# Patient Record
Sex: Male | Born: 1965 | Race: White | Hispanic: No | Marital: Single | Smoking: Never smoker
Health system: Southern US, Community
[De-identification: ages and names within clinical notes are randomized; demographics above are authoritative.]

## PROBLEM LIST (undated history)

## (undated) DIAGNOSIS — N201 Calculus of ureter: Secondary | ICD-10-CM

## (undated) DIAGNOSIS — E785 Hyperlipidemia, unspecified: Secondary | ICD-10-CM

## (undated) DIAGNOSIS — R109 Unspecified abdominal pain: Secondary | ICD-10-CM

## (undated) DIAGNOSIS — N529 Male erectile dysfunction, unspecified: Secondary | ICD-10-CM

## (undated) DIAGNOSIS — B009 Herpesviral infection, unspecified: Secondary | ICD-10-CM

## (undated) DIAGNOSIS — N200 Calculus of kidney: Secondary | ICD-10-CM

## (undated) HISTORY — DX: Male erectile dysfunction, unspecified: N52.9

## (undated) HISTORY — DX: Herpesviral infection, unspecified: B00.9

---

## 2010-01-04 ENCOUNTER — Encounter: Admission: RE | Admit: 2010-01-04 | Discharge: 2010-01-04 | Payer: Self-pay | Admitting: Family Medicine

## 2010-02-01 ENCOUNTER — Ambulatory Visit (HOSPITAL_COMMUNITY): Admission: RE | Admit: 2010-02-01 | Discharge: 2010-02-01 | Payer: Self-pay | Admitting: Urology

## 2010-02-01 HISTORY — PX: EXTRACORPOREAL SHOCK WAVE LITHOTRIPSY: SHX1557

## 2012-04-20 IMAGING — CT CT ABD-PELV W/O CM
3 of 4 series · 12 of 36 positions shown, 19 images · non-contrast
Comparison: None.

CLINICAL DATA: Abdominal pain.

CT ABDOMEN AND PELVIS WITHOUT CONTRAST
TECHNIQUE: Multidetector CT imaging of the abdomen and pelvis was
performed following the standard protocol without intravenous
contrast.

[Series 3: unenhanced · axial · 0.76mm/px · z∈[-405,-40]mm · 9 of 93 slices shown, 15 images]
[im 10/93  soft-tissue]
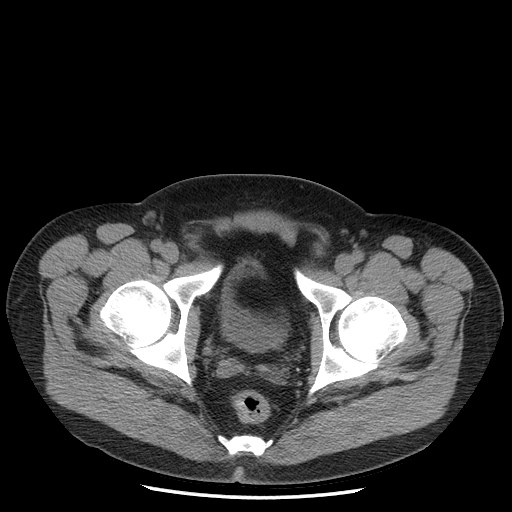
[im 10/93  bone]
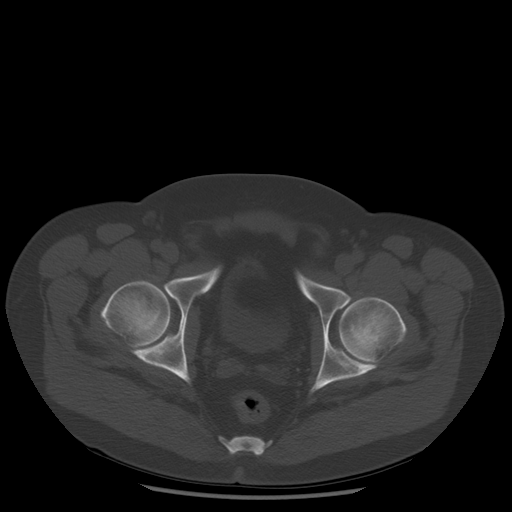
[im 19/93  soft-tissue]
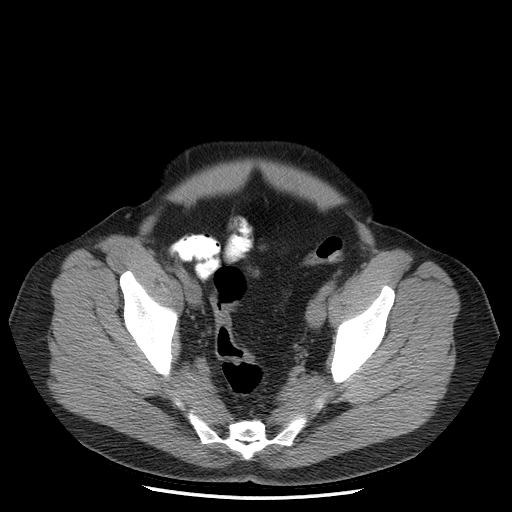
[im 28/93  soft-tissue]
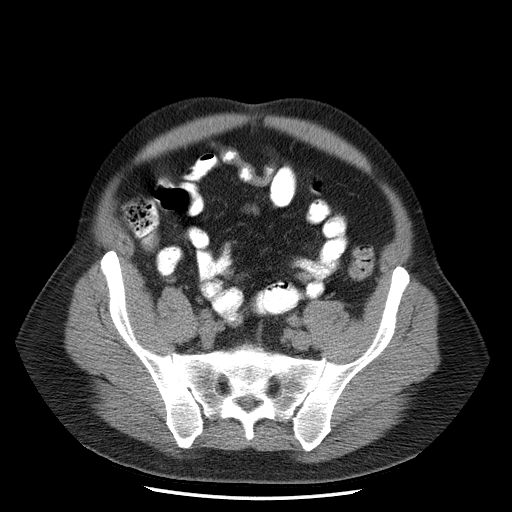
[im 37/93  soft-tissue]
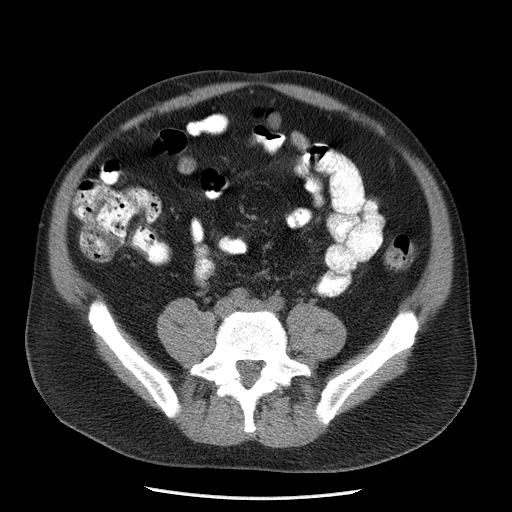
[im 47/93  soft-tissue]
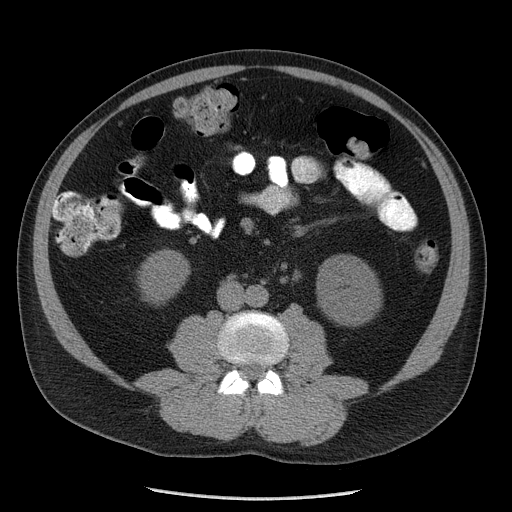
[im 56/93  soft-tissue]
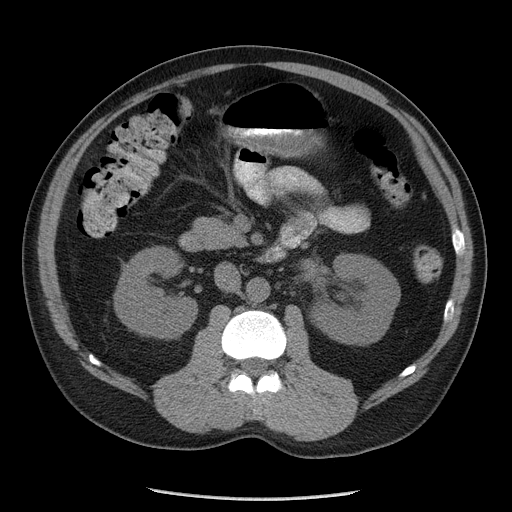
[im 56/93  lung]
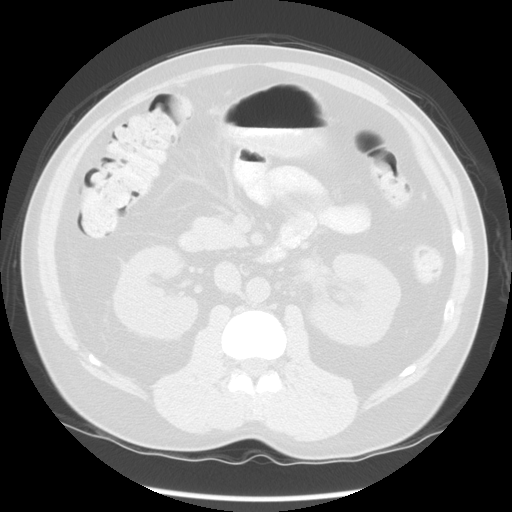
[im 65/93  soft-tissue]
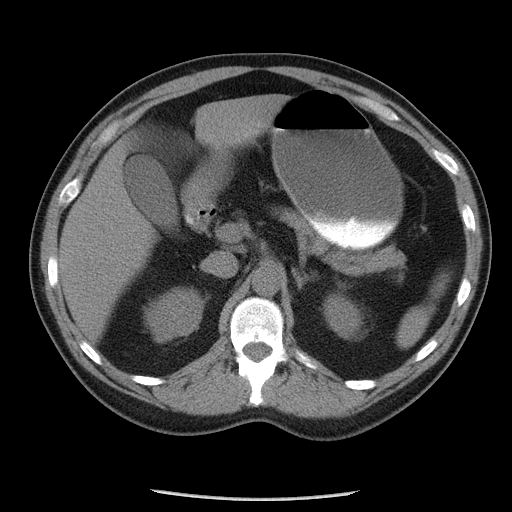
[im 65/93  lung]
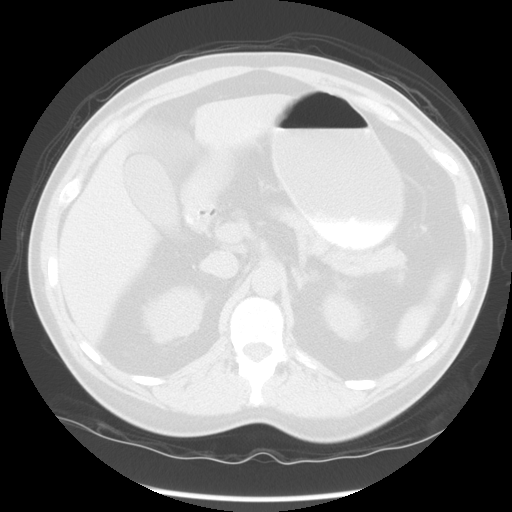
[im 74/93  soft-tissue]
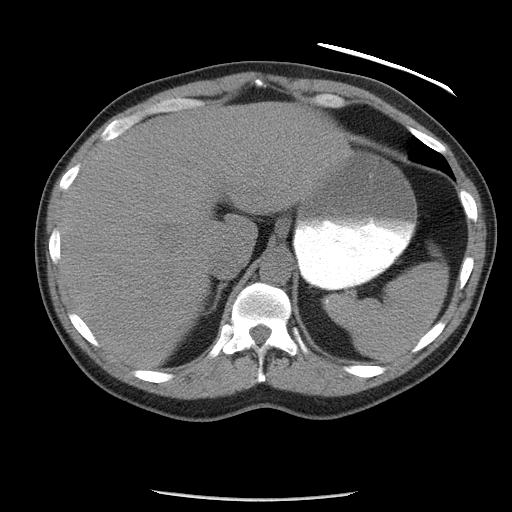
[im 74/93  lung]
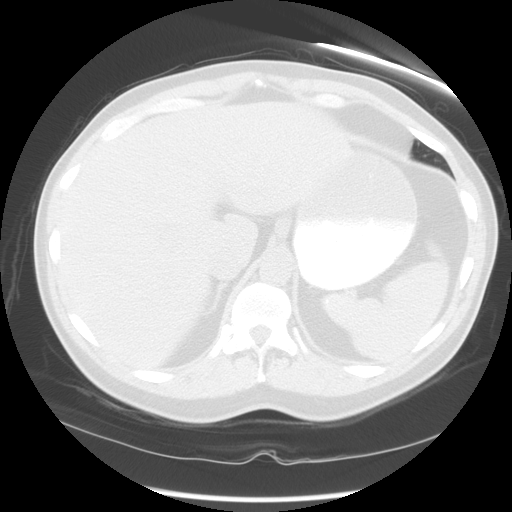
[im 83/93  soft-tissue]
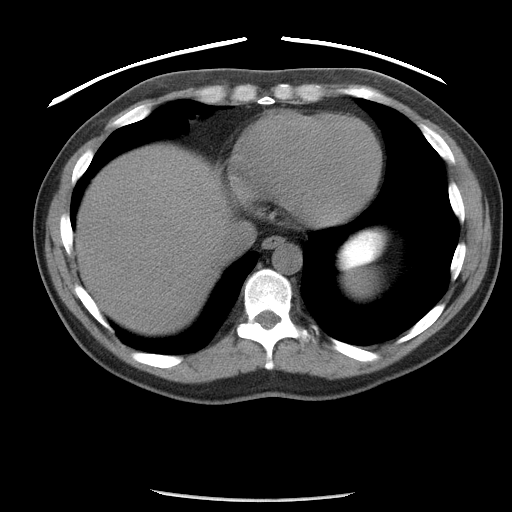
[im 83/93  lung]
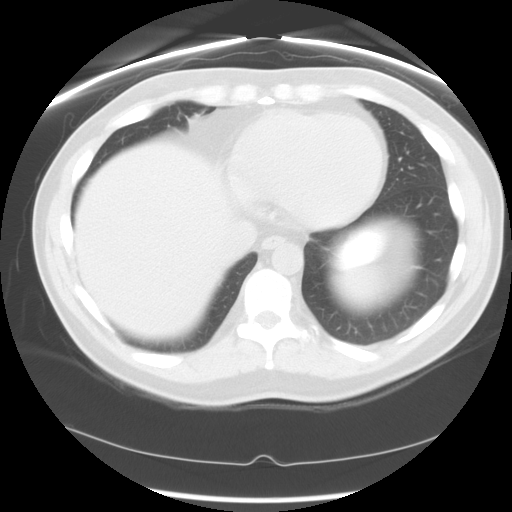
[im 83/93  bone]
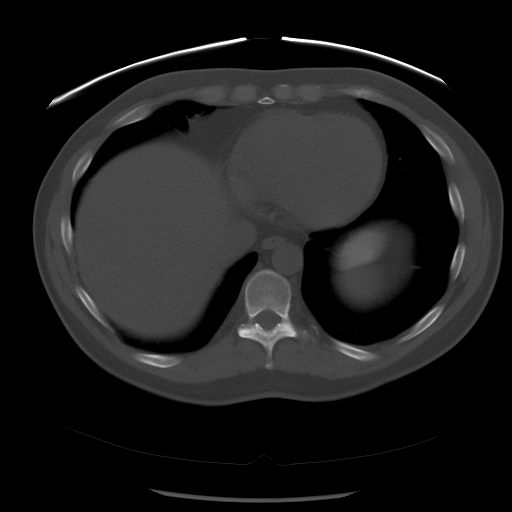

[Series 601: coronal body · coronal · 0.96mm/px · 1 of 137 slices shown, 2 images]
[im 46/137  soft-tissue]
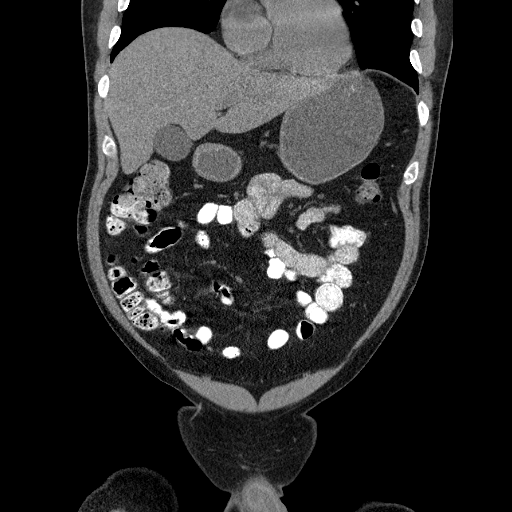
[im 46/137  bone]
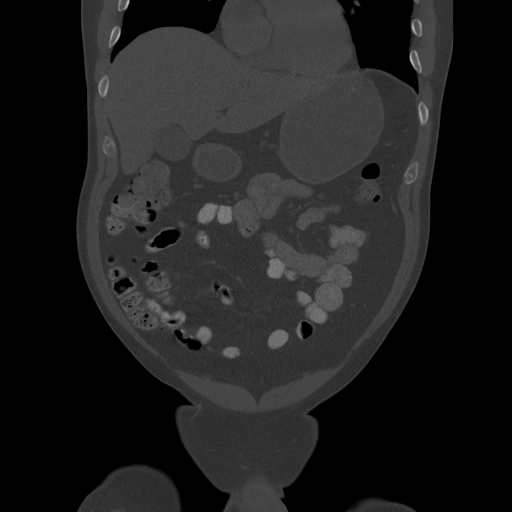

[Series 602: sagittal body · sagittal · 0.96mm/px · 2 of 157 slices shown]
[im 19/157  soft-tissue]
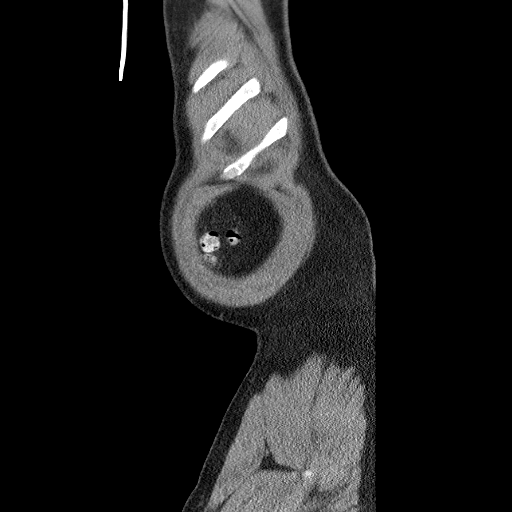
[im 37/157  soft-tissue]
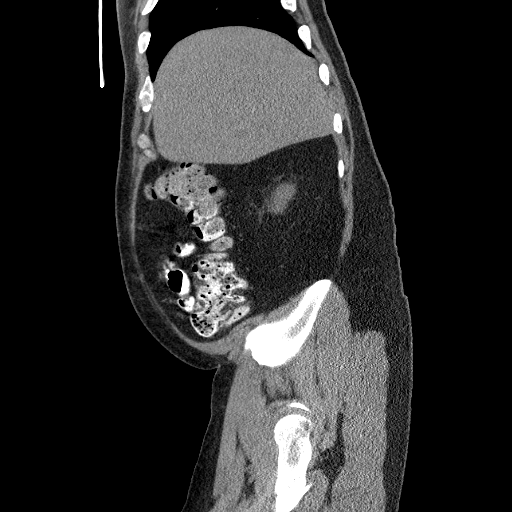

[12 of 36 positions shown; findings below may reference images not displayed]

FINDINGS: There is a 5 mm mid-left ureteral calculus present with
moderate left proximal hydroureter and left hydronephrosis.  There
are several small mid right renal calculi measuring approximately 2-
3 mm in size.  There is no right hydronephrosis.  The liver,
spleen, pancreas, adrenal glands, and gallbladder have a normal
appearance.  There is no adenopathy.  There is no free peritoneal
fluid or air.  The bowel, as visualized, has a normal appearance.

No pelvic mass or adenopathy is present.  Urinary bladder is
decompressed.
IMPRESSION: 1.  5 mm mid left ureteral calculus producing moderate left
hydronephrosis.
2.  Small right renal calculi.

## 2012-06-22 ENCOUNTER — Other Ambulatory Visit: Payer: Self-pay | Admitting: *Deleted

## 2012-08-08 ENCOUNTER — Encounter: Payer: Self-pay | Admitting: Family Medicine

## 2012-08-08 ENCOUNTER — Ambulatory Visit (INDEPENDENT_AMBULATORY_CARE_PROVIDER_SITE_OTHER): Payer: BC Managed Care – PPO | Admitting: Family Medicine

## 2012-08-08 VITALS — BP 139/81 | HR 78 | Temp 97.3°F | Ht 61.5 in | Wt 198.0 lb

## 2012-08-08 DIAGNOSIS — A6000 Herpesviral infection of urogenital system, unspecified: Secondary | ICD-10-CM

## 2012-08-08 DIAGNOSIS — N529 Male erectile dysfunction, unspecified: Secondary | ICD-10-CM

## 2012-08-08 MED ORDER — SILDENAFIL CITRATE 100 MG PO TABS: 100.0000 mg | ORAL_TABLET | Freq: Every day | ORAL | Status: DC | PRN

## 2012-08-08 MED ORDER — VALACYCLOVIR HCL 1 G PO TABS: 1000.0000 mg | ORAL_TABLET | Freq: Every day | ORAL | Status: DC

## 2012-08-08 NOTE — Progress Notes (Signed)
  Subjective:    Patient ID: Logan Mckee, male    DOB: 1965/08/20, 47 y.o.   MRN: [id number]  HPI Pt presents today for follow up of ED and genital herpes No acute issues or concerns ED and HSV well controlled with maintenance dose valtrex and viagra.  Would like refill today.     Review of Systems  All other systems reviewed and are negative.       Objective:   Physical Exam  Constitutional: He appears well-developed and well-nourished.  HENT:  Head: Normocephalic and atraumatic.  Eyes: Conjunctivae are normal. Pupils are equal, round, and reactive to light.  Neck: Normal range of motion.  Cardiovascular: Normal rate and regular rhythm.   Pulmonary/Chest: Effort normal.  Abdominal: Soft.  Musculoskeletal: Normal range of motion.  Neurological: He is alert.  Skin: Skin is warm.          Assessment & Plan:  Will refill meds.  Discussed LDL check. Pt deferred.  Follow up as needed.     The patient and/or caregiver has been counseled thoroughly with regard to treatment plan and/or medications prescribed including dosage, schedule, interactions, rationale for use, and possible side effects and they verbalize understanding. Diagnoses and expected course of recovery discussed and will return if not improved as expected or if the condition worsens. Patient and/or caregiver verbalized understanding.

## 2013-09-04 ENCOUNTER — Telehealth: Payer: Self-pay | Admitting: Physician Assistant

## 2013-09-04 NOTE — Telephone Encounter (Signed)
Apt scheduled.  

## 2013-09-05 ENCOUNTER — Ambulatory Visit (INDEPENDENT_AMBULATORY_CARE_PROVIDER_SITE_OTHER): Payer: BC Managed Care – PPO | Admitting: Physician Assistant

## 2013-09-05 ENCOUNTER — Encounter: Payer: Self-pay | Admitting: Physician Assistant

## 2013-09-05 VITALS — BP 118/74 | HR 83 | Temp 98.5°F | Ht 71.0 in | Wt 205.2 lb

## 2013-09-05 DIAGNOSIS — M79609 Pain in unspecified limb: Secondary | ICD-10-CM

## 2013-09-05 DIAGNOSIS — M79602 Pain in left arm: Secondary | ICD-10-CM

## 2013-09-05 NOTE — Patient Instructions (Signed)
Strain A strain is an injury to a muscle or the tissue that connects muscles to bones (tendon). In a strain injury, the muscle or tendon is either stretched or torn. Muscles are more susceptible to strains if they cross two joints, such as:  Hamstrings.  Quadriceps.  Calves.  Biceps. There are three categories of strains:  A first-degree strain is a small tear in the muscle. There is no lengthening of the muscle, but pain may be present with contraction of the muscle.  A second-degree strain is a small tear in the muscle accompanied by lengthening of the muscle. Muscles with a second-degree strain are still able to function.  A third-degree strain is a complete tear of the muscle. Muscles with a third-degree strain cannot function properly. Strains often have bleeding and bruising within the muscle. SYMPTOMS   Pain, tenderness, redness or bruising, and swelling in the area of injury.  Loss of normal mobility of the injured joint. CAUSES  A sudden force exerted on a muscle or tendon that it cannot withstand usually causes strains. This may be due to a sudden overload of a contracted muscle, overuse, or sudden increase or change in activity.  RISK INCREASES WITH:  Trauma.  Poor strength and flexibility.  Failure to warm-up properly before activity.  Return to activity before healing is complete. PREVENTION  Warm-up and stretch properly before and activity.  Maintain physical fitness:  Joint flexibility.  Muscle strength.  Endurance and conditioning.  Strengthen weak muscles with exercises to prevent recurrence. PROGNOSIS  If treated properly, strains are usually curable. The time it takes to recover is related to the severity of the injury and usually varies from 2 to 8 weeks. RELATED COMPLICATIONS   Re-injury or recurrence of symptoms, permanent weakness.  Joint stiffness if the strain is severe and rehabilitation is incomplete.  Delayed healing or resolution of  symptoms if sports are resumed before rehabilitation is complete.  Excessive bleeding into muscle, especially if taking anti-inflammatory medicines. This can lead to delayed recovery and injury to nerves, muscle, and blood vessels; this is an emergency. TREATMENT  Treatment initially involves ice and medicine to help reduce pain and inflammation. Use of the affected muscle should be limited by a:  Brace.  Elastic bandage wrapping.  Splint.  Cast.  Sling. Strengthening and stretching exercises may be necessary after immobilization to prevent joint stiffness. These exercises may be completed at home or with a therapist. If the tendon is torn, then surgery may be necessary to repair it.  MEDICATION   Avoid aspirin or ibuprofen in the first 48 hours after the injury. These medicines may increase the tendency to bleed. During this time, you may take pain relievers, such as acetaminophen, that do not affect bleeding.  After the first 48 hours, if pain medicine is necessary, then nonsteroidal anti-inflammatory medicines, such as aspirin and ibuprofen, or other minor pain relievers, such as acetaminophen, are often recommended.  Do not take pain medicine within 7 days before surgery.  Prescription pain relievers may be prescribed. Use only as directed and only as much as you need  Ointments applied to the skin may be helpful. HEAT AND COLD  Cold treatment (icing) relieves pain and reduces inflammation. Cold treatment should be applied for 10 to 15 minutes every 2 to 3 hours for inflammation and pain and immediately after any activity that aggravates your symptoms. Use ice packs or massage the area with a piece of ice (ice massage).  Heat treatment may be  used prior to performing the stretching and strengthening activities prescribed by your caregiver, physical therapist, or athletic trainer. Use a heat pack or soak your injury in warm water. SEEK MEDICAL CARE IF:   Symptoms get worse or do  not improve despite treatment.  Pain becomes intolerable.  You experience numbness or tingling.  Toes or fingernails become cold or develop a blue, gray, or dusky color.  New, unexplained symptoms develop (drugs used in treatment may produce side effects). Document Released: 03/07/2005 Document Revised: 05/30/2011 Document Reviewed: 06/19/2008 [facility] Patient Information 2015 [place], [place]. This information is not intended to replace advice given to you by your health care provider. Make sure you discuss any questions you have with your health care provider.

## 2013-09-05 NOTE — Progress Notes (Signed)
Subjective:     Patient ID: Logan Mckee, male   DOB: 20-Mar-1966, 48 y.o.   MRN: [id number]  HPI Pt with L arm and shoulder pain Pt states he was at work and had to move a bunch of cans He noted pain to the lateral elbow and post shoulder that night He then began to use heat and sx got better When he told his work they filed a WC claim and sent him to their MD He has done full work since His claim was denied and now work is telling him he needs a note form his MD that it is ok to work  Review of Systems  Musculoskeletal: Negative.        Objective:   Physical Exam As noted pt having no sx at time of appt No ecchy/edema seen No palp spasm or TTP of the L arm/shoulder FROM of LUE w/o sx Good pulses Nl strength    Assessment:     L arm/shoulder pain    Plan:     Pt w/o sx today Cont to observe Note for pt to full duty F/U prn

## 2013-09-10 ENCOUNTER — Encounter: Payer: Self-pay | Admitting: Physician Assistant

## 2013-09-10 ENCOUNTER — Ambulatory Visit (INDEPENDENT_AMBULATORY_CARE_PROVIDER_SITE_OTHER): Payer: BC Managed Care – PPO | Admitting: Physician Assistant

## 2013-09-10 ENCOUNTER — Other Ambulatory Visit: Payer: Self-pay | Admitting: Nurse Practitioner

## 2013-09-10 ENCOUNTER — Encounter (INDEPENDENT_AMBULATORY_CARE_PROVIDER_SITE_OTHER): Payer: Self-pay

## 2013-09-10 VITALS — BP 108/74 | HR 67 | Temp 98.0°F | Ht 71.0 in | Wt 204.2 lb

## 2013-09-10 DIAGNOSIS — Z0000 Encounter for general adult medical examination without abnormal findings: Secondary | ICD-10-CM

## 2013-09-10 DIAGNOSIS — N529 Male erectile dysfunction, unspecified: Secondary | ICD-10-CM | POA: Insufficient documentation

## 2013-09-10 MED ORDER — SILDENAFIL CITRATE 100 MG PO TABS: 100.0000 mg | ORAL_TABLET | Freq: Every day | ORAL | Status: DC | PRN

## 2013-09-10 MED ORDER — VALACYCLOVIR HCL 1 G PO TABS: 1000.0000 mg | ORAL_TABLET | Freq: Every day | ORAL | Status: DC

## 2013-09-10 NOTE — Progress Notes (Signed)
Subjective:     Patient ID: Logan Mckee, male   DOB: 1965/08/05, 48 y.o.   MRN: [id number]  HPI Pt here for PE States he has been doing well PMH of HSV and has not had an outbreak in 5 yrs since taking Valtrex Also hx of ED and doing well on Viagra Pt with repetitive job and has intermit L elbow pain Responds well to NSAIDS and rest  Review of Systems  Constitutional: Negative.   HENT: Negative.   Eyes: Negative.   Respiratory: Negative.   Cardiovascular: Negative.   Gastrointestinal: Negative.   Endocrine: Negative.   Genitourinary: Negative.   Musculoskeletal: Positive for myalgias.       L lateral elbow pain Denies any numbness  Skin: Negative.   Allergic/Immunologic: Negative.   Neurological: Negative.   Hematological: Negative.   Psychiatric/Behavioral: Negative.        Objective:   Physical Exam  Nursing note and vitals reviewed. Constitutional: He is oriented to person, place, and time. He appears well-developed and well-nourished.  HENT:  Head: Normocephalic and atraumatic.  Right Ear: External ear normal.  Left Ear: External ear normal.  Nose: Nose normal.  Mouth/Throat: Oropharynx is clear and moist.  Eyes: Conjunctivae and EOM are normal. Pupils are equal, round, and reactive to light.  Neck: Normal range of motion. Neck supple. No JVD present. No thyromegaly present.  Cardiovascular: Normal rate, regular rhythm, normal heart sounds and intact distal pulses.   Pulmonary/Chest: Effort normal and breath sounds normal. He exhibits no tenderness.  Abdominal: Soft. Bowel sounds are normal. He exhibits no distension and no mass. There is no tenderness. There is no rebound and no guarding.  Genitourinary: Penis normal.  Musculoskeletal: Normal range of motion. He exhibits no edema.  + TTP over the lateral epicond  Lymphadenopathy:    He has no cervical adenopathy.  Neurological: He is alert and oriented to person, place, and time. He has normal reflexes. No  cranial nerve deficit.  Skin: Skin is warm and dry.  Psychiatric: He has a normal mood and affect. His behavior is normal. Judgment and thought content normal.       Assessment:     Physical exam    Plan:     Discussed beginning a regular walking program and eating healthy RF of Valtrex and Viagra done today Full labs to include PSA done today- will inform of lab results OTC tennis elbow strap to help with his elbow pain Cont heat, ice, OTC NSAIDS F/U prn/pending labs

## 2013-09-10 NOTE — Telephone Encounter (Signed)
Viagra 100mg ,1 gd, qty 30,   2 refills and Valtrex 1000mg , gd, qty 90 and 2 refills, per provider, called to [facility] pharmacist.

## 2013-09-10 NOTE — Patient Instructions (Signed)
Exercise to Stay Healthy Exercise helps you become and stay healthy. EXERCISE IDEAS AND TIPS Choose exercises that:  You enjoy.  Fit into your day. You do not need to exercise really hard to be healthy. You can do exercises at a slow or medium level and stay healthy. You can:  Stretch before and after working out.  Try yoga, Pilates, or tai chi.  Lift weights.  Walk fast, swim, jog, run, climb stairs, bicycle, dance, or rollerskate.  Take aerobic classes. Exercises that burn about 150 calories:  Running 1  miles in 15 minutes.  Playing volleyball for 45 to 60 minutes.  Washing and waxing a car for 45 to 60 minutes.  Playing touch football for 45 minutes.  Walking 1  miles in 35 minutes.  Pushing a stroller 1  miles in 30 minutes.  Playing basketball for 30 minutes.  Raking leaves for 30 minutes.  Bicycling 5 miles in 30 minutes.  Walking 2 miles in 30 minutes.  Dancing for 30 minutes.  Shoveling snow for 15 minutes.  Swimming laps for 20 minutes.  Walking up stairs for 15 minutes.  Bicycling 4 miles in 15 minutes.  Gardening for 30 to 45 minutes.  Jumping rope for 15 minutes.  Washing windows or floors for 45 to 60 minutes. Document Released: 04/09/2010 Document Revised: 05/30/2011 Document Reviewed: 04/09/2010 Exit[employer] Patient Information 2015 [employer]. This information is not intended to replace advice given to you by your health care provider. Make sure you discuss any questions you have with your health care provider.  

## 2013-09-11 LAB — LIPID PANEL
CHOLESTEROL TOTAL: 192 mg/dL (ref 100–199)
Chol/HDL Ratio: 5.6 ratio units — ABNORMAL HIGH (ref 0.0–5.0)
HDL: 34 mg/dL — ABNORMAL LOW (ref >39)
LDL Calculated: 103 mg/dL — ABNORMAL HIGH (ref 0–99)
Triglycerides: 274 mg/dL — ABNORMAL HIGH (ref 0–149)
VLDL CHOLESTEROL CAL: 55 mg/dL — AB (ref 5–40)

## 2013-09-11 LAB — CMP14+EGFR
ALBUMIN: 4.4 g/dL (ref 3.5–5.5)
ALK PHOS: 70 IU/L (ref 39–117)
ALT: 35 IU/L (ref 0–44)
AST: 22 IU/L (ref 0–40)
Albumin/Globulin Ratio: 2 (ref 1.1–2.5)
BILIRUBIN TOTAL: 1 mg/dL (ref 0.0–1.2)
BUN / CREAT RATIO: 20 (ref 9–20)
BUN: 14 mg/dL (ref 6–24)
CO2: 23 mmol/L (ref 18–29)
CREATININE: 0.71 mg/dL — AB (ref 0.76–1.27)
Calcium: 9.3 mg/dL (ref 8.7–10.2)
Chloride: 103 mmol/L (ref 97–108)
GFR calc non Af Amer: 112 mL/min/{1.73_m2} (ref >59)
GFR, EST AFRICAN AMERICAN: 129 mL/min/{1.73_m2} (ref >59)
GLOBULIN, TOTAL: 2.2 g/dL (ref 1.5–4.5)
Glucose: 92 mg/dL (ref 65–99)
Potassium: 4.3 mmol/L (ref 3.5–5.2)
Sodium: 139 mmol/L (ref 134–144)
Total Protein: 6.6 g/dL (ref 6.0–8.5)

## 2013-09-11 LAB — PSA, TOTAL AND FREE
PSA, Free Pct: 22.9 %
PSA, Free: 0.16 ng/mL
PSA: 0.7 ng/mL (ref 0.0–4.0)

## 2013-09-12 ENCOUNTER — Telehealth: Payer: Self-pay | Admitting: Physician Assistant

## 2013-09-12 NOTE — Telephone Encounter (Signed)
Left detailed message with results

## 2014-10-06 ENCOUNTER — Telehealth: Payer: Self-pay | Admitting: Physician Assistant

## 2014-10-07 ENCOUNTER — Other Ambulatory Visit: Payer: Self-pay | Admitting: Nurse Practitioner

## 2014-10-07 MED ORDER — SILDENAFIL CITRATE 100 MG PO TABS: 100.0000 mg | ORAL_TABLET | Freq: Every day | ORAL | Status: DC | PRN

## 2014-10-07 MED ORDER — VALACYCLOVIR HCL 1 G PO TABS: 1000.0000 mg | ORAL_TABLET | Freq: Every day | ORAL | Status: DC

## 2014-10-07 NOTE — Addendum Note (Signed)
Addended by: Jannifer RodneyHAWKS, Shonnie Poudrier A on: 10/07/2014 04:31 PM   Modules accepted: Orders

## 2014-10-07 NOTE — Telephone Encounter (Signed)
Prescription sent to pharmacy.

## 2014-10-08 ENCOUNTER — Ambulatory Visit: Payer: Self-pay | Admitting: Family

## 2014-10-10 ENCOUNTER — Ambulatory Visit (INDEPENDENT_AMBULATORY_CARE_PROVIDER_SITE_OTHER): Payer: BLUE CROSS/BLUE SHIELD | Admitting: Family

## 2014-10-10 ENCOUNTER — Encounter: Payer: Self-pay | Admitting: Family

## 2014-10-10 VITALS — BP 140/91 | HR 95 | Temp 97.1°F | Ht 71.0 in | Wt 196.2 lb

## 2014-10-10 DIAGNOSIS — IMO0001 Reserved for inherently not codable concepts without codable children: Secondary | ICD-10-CM

## 2014-10-10 DIAGNOSIS — Z0000 Encounter for general adult medical examination without abnormal findings: Secondary | ICD-10-CM | POA: Diagnosis not present

## 2014-10-10 DIAGNOSIS — N529 Male erectile dysfunction, unspecified: Secondary | ICD-10-CM | POA: Diagnosis not present

## 2014-10-10 DIAGNOSIS — R030 Elevated blood-pressure reading, without diagnosis of hypertension: Secondary | ICD-10-CM

## 2014-10-10 DIAGNOSIS — A6000 Herpesviral infection of urogenital system, unspecified: Secondary | ICD-10-CM

## 2014-10-10 LAB — POCT CBC
Granulocyte percent: 67.5 %G (ref 37–80)
HEMATOCRIT: 52.5 % (ref 43.5–53.7)
HEMOGLOBIN: 16.9 g/dL (ref 14.1–18.1)
LYMPH, POC: 2.2 (ref 0.6–3.4)
MCH: 27.8 pg (ref 27–31.2)
MCHC: 32.2 g/dL (ref 31.8–35.4)
MCV: 86.4 fL (ref 80–97)
MPV: 8.2 fL (ref 0–99.8)
PLATELET COUNT, POC: 312 10*3/uL (ref 142–424)
POC Granulocyte: 5.3 (ref 2–6.9)
POC LYMPH PERCENT: 28.8 %L (ref 10–50)
RBC: 6.07 M/uL (ref 4.69–6.13)
RDW, POC: 13.8 %
WBC: 7.8 10*3/uL (ref 4.6–10.2)

## 2014-10-10 MED ORDER — VALACYCLOVIR HCL 1 G PO TABS: 1000.0000 mg | ORAL_TABLET | Freq: Every day | ORAL | Status: DC

## 2014-10-10 MED ORDER — SILDENAFIL CITRATE 100 MG PO TABS: 100.0000 mg | ORAL_TABLET | Freq: Every day | ORAL | Status: DC | PRN

## 2014-10-10 MED ORDER — SILDENAFIL CITRATE 20 MG PO TABS: ORAL_TABLET | ORAL | Status: DC

## 2014-10-10 NOTE — Patient Instructions (Signed)

## 2014-10-10 NOTE — Progress Notes (Signed)
   Subjective:    Patient ID: Logan Mckee, male    DOB: 11-20-1965, 49 y.o.   MRN: [id number]  HPI Pt presents to the office today for CPE. PT takes several herbal OTC medications with two prescription medications for erectile dysfunction and genital herpes. Pt states since starting on the valtrex he has not had an outbreak. Pt denies any headache, palpitations, SOB, or edema at this time.     Review of Systems  Constitutional: Negative.   HENT: Negative.   Respiratory: Negative.   Cardiovascular: Negative.   Gastrointestinal: Negative.   Endocrine: Negative.   Genitourinary: Negative.   Musculoskeletal: Negative.   Neurological: Negative.   Hematological: Negative.   Psychiatric/Behavioral: Negative.   All other systems reviewed and are negative.      Objective:   Physical Exam  Constitutional: He is oriented to person, place, and time. He appears well-developed and well-nourished. No distress.  HENT:  Head: Normocephalic.  Right Ear: External ear normal.  Left Ear: External ear normal.  Nose: Nose normal.  Mouth/Throat: Oropharynx is clear and moist.  Eyes: Pupils are equal, round, and reactive to light. Right eye exhibits no discharge. Left eye exhibits no discharge.  Neck: Normal range of motion. Neck supple. No thyromegaly present.  Cardiovascular: Normal rate, regular rhythm, normal heart sounds and intact distal pulses.   No murmur heard. Pulmonary/Chest: Effort normal and breath sounds normal. No respiratory distress. He has no wheezes.  Abdominal: Soft. Bowel sounds are normal. He exhibits no distension. There is no tenderness.  Musculoskeletal: Normal range of motion. He exhibits no edema or tenderness.  Neurological: He is alert and oriented to person, place, and time. He has normal reflexes. No cranial nerve deficit.  Skin: Skin is warm and dry. No rash noted. No erythema.  Psychiatric: He has a normal mood and affect. His behavior is normal. Judgment and thought  content normal.  Vitals reviewed.   BP 140/91 mmHg  Pulse 95  Temp(Src) 97.1 F (36.2 C) (Oral)  Ht _0  (1.803 m)  Wt 196 lb 3.2 oz (88.996 kg)  BMI 27.38 kg/m2       Assessment & Plan:  1. Erectile dysfunction, unspecified erectile dysfunction type - sildenafil (VIAGRA) 100 MG tablet; Take 1 tablet (100 mg total) by mouth daily as needed for erectile dysfunction.  Dispense: 30 tablet; Refill: 2  2. Genital herpes simplex type 2 - valACYclovir (VALTREX) 1000 MG tablet; Take 1 tablet (1,000 mg total) by mouth daily.  Dispense: 90 tablet; Refill: 2  3. Annual physical exam - POCT CBC - CMP14+EGFR - Lipid panel - Thyroid Panel With TSH - Vit D  25 hydroxy (rtn osteoporosis monitoring) - PSA Total+%Free (Serial)  4. Elevated blood pressure -Pt states his aunt just passed away and is very stressed-Last visit pt's BP was 108/74 -Pt to monitor BP at home -If next visit still elevated will need medications -Dash diet encouraged   Continue all meds Labs pending Health Maintenance reviewed Diet and exercise encouraged RTO 1 year  Evelina Dun, FNP

## 2014-10-11 LAB — CMP14+EGFR
ALBUMIN: 5.1 g/dL (ref 3.5–5.5)
ALK PHOS: 81 IU/L (ref 39–117)
ALT: 35 IU/L (ref 0–44)
AST: 22 IU/L (ref 0–40)
Albumin/Globulin Ratio: 1.9 (ref 1.1–2.5)
BUN / CREAT RATIO: 14 (ref 9–20)
BUN: 11 mg/dL (ref 6–24)
Bilirubin Total: 1.1 mg/dL (ref 0.0–1.2)
CO2: 21 mmol/L (ref 18–29)
Calcium: 9.9 mg/dL (ref 8.7–10.2)
Chloride: 98 mmol/L (ref 97–108)
Creatinine, Ser: 0.8 mg/dL (ref 0.76–1.27)
GFR, EST AFRICAN AMERICAN: 122 mL/min/{1.73_m2} (ref >59)
GFR, EST NON AFRICAN AMERICAN: 106 mL/min/{1.73_m2} (ref >59)
GLUCOSE: 95 mg/dL (ref 65–99)
Globulin, Total: 2.7 g/dL (ref 1.5–4.5)
POTASSIUM: 4.3 mmol/L (ref 3.5–5.2)
Sodium: 141 mmol/L (ref 134–144)
TOTAL PROTEIN: 7.8 g/dL (ref 6.0–8.5)

## 2014-10-11 LAB — PSA, TOTAL AND FREE
PROSTATE SPECIFIC AG, SERUM: 1.2 ng/mL (ref 0.0–4.0)
PSA, Free Pct: 16.7 %
PSA, Free: 0.2 ng/mL

## 2014-10-11 LAB — THYROID PANEL WITH TSH
FREE THYROXINE INDEX: 1.9 (ref 1.2–4.9)
T3 UPTAKE RATIO: 25 % (ref 24–39)
T4 TOTAL: 7.7 ug/dL (ref 4.5–12.0)
TSH: 1.48 u[IU]/mL (ref 0.450–4.500)

## 2014-10-11 LAB — LIPID PANEL
CHOL/HDL RATIO: 5.1 ratio — AB (ref 0.0–5.0)
Cholesterol, Total: 241 mg/dL — ABNORMAL HIGH (ref 100–199)
HDL: 47 mg/dL (ref >39)
LDL Calculated: 133 mg/dL — ABNORMAL HIGH (ref 0–99)
TRIGLYCERIDES: 307 mg/dL — AB (ref 0–149)
VLDL CHOLESTEROL CAL: 61 mg/dL — AB (ref 5–40)

## 2014-10-11 LAB — VITAMIN D 25 HYDROXY (VIT D DEFICIENCY, FRACTURES): VIT D 25 HYDROXY: 32.3 ng/mL (ref 30.0–100.0)

## 2014-10-13 ENCOUNTER — Other Ambulatory Visit: Payer: Self-pay | Admitting: Family

## 2014-10-13 DIAGNOSIS — E785 Hyperlipidemia, unspecified: Secondary | ICD-10-CM | POA: Insufficient documentation

## 2014-10-13 MED ORDER — ATORVASTATIN CALCIUM 40 MG PO TABS: 40.0000 mg | ORAL_TABLET | Freq: Every day | ORAL | Status: DC

## 2015-10-15 ENCOUNTER — Other Ambulatory Visit: Payer: Self-pay | Admitting: Family

## 2015-10-15 NOTE — Telephone Encounter (Signed)
Last seen 10/10/2014. Please review. Christy's patient

## 2016-05-31 ENCOUNTER — Encounter (HOSPITAL_COMMUNITY)
Admission: AD | Disposition: A | Discharge status: Home / Self Care | Payer: Self-pay | Source: Ambulatory Visit | Attending: Urology

## 2016-05-31 ENCOUNTER — Other Ambulatory Visit: Payer: Self-pay | Admitting: Urology

## 2016-05-31 ENCOUNTER — Inpatient Hospital Stay (HOSPITAL_COMMUNITY): Payer: BLUE CROSS/BLUE SHIELD | Admitting: Anesthesiology

## 2016-05-31 ENCOUNTER — Encounter (HOSPITAL_COMMUNITY): Payer: Self-pay | Admitting: *Deleted

## 2016-05-31 ENCOUNTER — Observation Stay (HOSPITAL_COMMUNITY)
Admission: AD | Admit: 2016-05-31 | Discharge: 2016-06-01 | Disposition: A | Discharge status: Home / Self Care | Payer: BLUE CROSS/BLUE SHIELD | Source: Ambulatory Visit | Attending: Urology | Admitting: Urology

## 2016-05-31 DIAGNOSIS — E785 Hyperlipidemia, unspecified: Secondary | ICD-10-CM | POA: Diagnosis not present

## 2016-05-31 DIAGNOSIS — Z79899 Other long term (current) drug therapy: Secondary | ICD-10-CM | POA: Insufficient documentation

## 2016-05-31 DIAGNOSIS — Z466 Encounter for fitting and adjustment of urinary device: Secondary | ICD-10-CM | POA: Diagnosis not present

## 2016-05-31 DIAGNOSIS — R112 Nausea with vomiting, unspecified: Secondary | ICD-10-CM | POA: Diagnosis not present

## 2016-05-31 DIAGNOSIS — N201 Calculus of ureter: Secondary | ICD-10-CM | POA: Diagnosis not present

## 2016-05-31 DIAGNOSIS — N132 Hydronephrosis with renal and ureteral calculous obstruction: Secondary | ICD-10-CM | POA: Diagnosis not present

## 2016-05-31 DIAGNOSIS — N5201 Erectile dysfunction due to arterial insufficiency: Secondary | ICD-10-CM | POA: Diagnosis not present

## 2016-05-31 DIAGNOSIS — R3129 Other microscopic hematuria: Secondary | ICD-10-CM | POA: Diagnosis not present

## 2016-05-31 DIAGNOSIS — N200 Calculus of kidney: Secondary | ICD-10-CM | POA: Diagnosis not present

## 2016-05-31 HISTORY — PX: CYSTOSCOPY W/ URETERAL STENT PLACEMENT: SHX1429

## 2016-05-31 LAB — CBC
HCT: 44.4 % (ref 39.0–52.0)
Hemoglobin: 15.5 g/dL (ref 13.0–17.0)
MCH: 29.7 pg (ref 26.0–34.0)
MCHC: 34.9 g/dL (ref 30.0–36.0)
MCV: 85.1 fL (ref 78.0–100.0)
Platelets: 286 10*3/uL (ref 150–400)
RBC: 5.22 MIL/uL (ref 4.22–5.81)
RDW: 13.6 % (ref 11.5–15.5)
WBC: 17.3 10*3/uL — AB (ref 4.0–10.5)

## 2016-05-31 SURGERY — CYSTOSCOPY, WITH RETROGRADE PYELOGRAM AND URETERAL STENT INSERTION
Anesthesia: General | Laterality: Right

## 2016-05-31 MED ORDER — ONDANSETRON HCL 4 MG/2ML IJ SOLN
INTRAMUSCULAR | Status: AC
  Filled 2016-05-31: qty 2

## 2016-05-31 MED ORDER — SUCCINYLCHOLINE CHLORIDE 200 MG/10ML IV SOSY
PREFILLED_SYRINGE | INTRAVENOUS | Status: DC | PRN
  Administered 2016-05-31: 140 mg via INTRAVENOUS

## 2016-05-31 MED ORDER — MIDAZOLAM HCL 2 MG/2ML IJ SOLN
INTRAMUSCULAR | Status: AC
  Filled 2016-05-31: qty 2

## 2016-05-31 MED ORDER — MIDAZOLAM HCL 5 MG/5ML IJ SOLN
INTRAMUSCULAR | Status: DC | PRN
  Administered 2016-05-31: 2 mg via INTRAVENOUS

## 2016-05-31 MED ORDER — DEXTROSE 5 % IV SOLN
2.0000 g | INTRAVENOUS | Status: AC
  Administered 2016-05-31: 2 g via INTRAVENOUS
  Filled 2016-05-31 (×2): qty 2

## 2016-05-31 MED ORDER — ATORVASTATIN CALCIUM 40 MG PO TABS
40.0000 mg | ORAL_TABLET | Freq: Every day | ORAL | Status: DC
  Administered 2016-05-31 – 2016-06-01 (×2): 40 mg via ORAL
  Filled 2016-05-31 (×2): qty 1

## 2016-05-31 MED ORDER — FENTANYL CITRATE (PF) 100 MCG/2ML IJ SOLN
INTRAMUSCULAR | Status: AC
  Filled 2016-05-31: qty 2

## 2016-05-31 MED ORDER — DEXAMETHASONE SODIUM PHOSPHATE 10 MG/ML IJ SOLN
INTRAMUSCULAR | Status: DC | PRN
  Administered 2016-05-31: 10 mg via INTRAVENOUS

## 2016-05-31 MED ORDER — HYDROMORPHONE HCL 1 MG/ML IJ SOLN
INTRAMUSCULAR | Status: AC
  Filled 2016-05-31: qty 1

## 2016-05-31 MED ORDER — HYDROMORPHONE HCL 1 MG/ML IJ SOLN
0.2500 mg | INTRAMUSCULAR | Status: DC | PRN
  Administered 2016-05-31 (×2): 0.5 mg via INTRAVENOUS

## 2016-05-31 MED ORDER — 0.9 % SODIUM CHLORIDE (POUR BTL) OPTIME
TOPICAL | Status: DC | PRN
  Administered 2016-05-31: 1000 mL

## 2016-05-31 MED ORDER — KCL IN DEXTROSE-NACL 20-5-0.45 MEQ/L-%-% IV SOLN
INTRAVENOUS | Status: DC
  Administered 2016-05-31 – 2016-06-01 (×2): via INTRAVENOUS
  Filled 2016-05-31 (×3): qty 1000

## 2016-05-31 MED ORDER — HYDROMORPHONE HCL 1 MG/ML IJ SOLN
0.5000 mg | INTRAMUSCULAR | Status: DC | PRN
  Administered 2016-05-31 – 2016-06-01 (×2): 1 mg via INTRAVENOUS
  Filled 2016-05-31 (×2): qty 1

## 2016-05-31 MED ORDER — PROMETHAZINE HCL 25 MG/ML IJ SOLN: 6.2500 mg | INTRAMUSCULAR | Status: DC | PRN

## 2016-05-31 MED ORDER — MIDAZOLAM HCL 2 MG/2ML IJ SOLN: 0.5000 mg | Freq: Once | INTRAMUSCULAR | Status: DC | PRN

## 2016-05-31 MED ORDER — SENNOSIDES-DOCUSATE SODIUM 8.6-50 MG PO TABS
1.0000 | ORAL_TABLET | Freq: Two times a day (BID) | ORAL | Status: DC
  Administered 2016-05-31 – 2016-06-01 (×2): 1 via ORAL
  Filled 2016-05-31 (×2): qty 1

## 2016-05-31 MED ORDER — PROPOFOL 10 MG/ML IV BOLUS
INTRAVENOUS | Status: AC
  Filled 2016-05-31: qty 20

## 2016-05-31 MED ORDER — LIDOCAINE 2% (20 MG/ML) 5 ML SYRINGE
INTRAMUSCULAR | Status: DC | PRN
  Administered 2016-05-31: 100 mg via INTRAVENOUS
  Administered 2016-05-31: 50 mg via INTRAVENOUS

## 2016-05-31 MED ORDER — IOHEXOL 300 MG/ML  SOLN
INTRAMUSCULAR | Status: DC | PRN
  Administered 2016-05-31: 10 mL

## 2016-05-31 MED ORDER — PROPOFOL 10 MG/ML IV BOLUS
INTRAVENOUS | Status: DC | PRN
  Administered 2016-05-31: 180 mg via INTRAVENOUS

## 2016-05-31 MED ORDER — ONDANSETRON HCL 4 MG/2ML IJ SOLN
INTRAMUSCULAR | Status: DC | PRN
  Administered 2016-05-31: 4 mg via INTRAVENOUS

## 2016-05-31 MED ORDER — ACETAMINOPHEN 500 MG PO TABS
1000.0000 mg | ORAL_TABLET | Freq: Three times a day (TID) | ORAL | Status: AC
  Administered 2016-05-31 – 2016-06-01 (×3): 1000 mg via ORAL
  Filled 2016-05-31 (×3): qty 2

## 2016-05-31 MED ORDER — FENTANYL CITRATE (PF) 100 MCG/2ML IJ SOLN
INTRAMUSCULAR | Status: DC | PRN
  Administered 2016-05-31 (×2): 50 ug via INTRAVENOUS
  Administered 2016-05-31: 100 ug via INTRAVENOUS

## 2016-05-31 MED ORDER — LACTATED RINGERS IV SOLN
INTRAVENOUS | Status: DC
  Administered 2016-05-31 (×2): via INTRAVENOUS

## 2016-05-31 MED ORDER — OXYCODONE HCL 5 MG PO TABS
5.0000 mg | ORAL_TABLET | ORAL | Status: DC | PRN
  Administered 2016-05-31 – 2016-06-01 (×4): 5 mg via ORAL
  Filled 2016-05-31 (×4): qty 1

## 2016-05-31 MED ORDER — VALACYCLOVIR HCL 500 MG PO TABS
1000.0000 mg | ORAL_TABLET | Freq: Every day | ORAL | Status: DC
  Administered 2016-05-31 – 2016-06-01 (×2): 1000 mg via ORAL
  Filled 2016-05-31 (×2): qty 2

## 2016-05-31 SURGICAL SUPPLY — 12 items
BAG URO CATCHER STRL LF (MISCELLANEOUS) ×2 IMPLANT
BASKET ZERO TIP NITINOL 2.4FR (BASKET) IMPLANT
CATH INTERMIT  6FR 70CM (CATHETERS) IMPLANT
CLOTH BEACON ORANGE TIMEOUT ST (SAFETY) ×2 IMPLANT
GLOVE BIOGEL M STRL SZ7.5 (GLOVE) ×2 IMPLANT
GOWN STRL REUS W/TWL LRG LVL3 (GOWN DISPOSABLE) ×4 IMPLANT
GUIDEWIRE ANG ZIPWIRE 038X150 (WIRE) IMPLANT
GUIDEWIRE STR DUAL SENSOR (WIRE) ×2 IMPLANT
MANIFOLD NEPTUNE II (INSTRUMENTS) ×2 IMPLANT
PACK CYSTO (CUSTOM PROCEDURE TRAY) ×2 IMPLANT
STENT POLARIS 5FRX26 (STENTS) ×2 IMPLANT
TUBING CONNECTING 10 (TUBING) ×2 IMPLANT

## 2016-05-31 NOTE — Transfer of Care (Signed)
Immediate Anesthesia Transfer of Care Note  Patient: Logan Mckee  Procedure(s) Performed: Procedure(s): CYSTOSCOPY WITH RETROGRADE PYELOGRAM/ RIGHT URETERAL STENT PLACEMENT (Right)  Patient Location: PACU  Anesthesia Type:General  Level of Consciousness: awake, alert  and oriented  Airway & Oxygen Therapy: Patient Spontanous Breathing and Patient connected to face mask oxygen  Post-op Assessment: Report given to RN and Post -op Vital signs reviewed and stable  Post vital signs: Reviewed and stable  Last Vitals:  Vitals:   05/31/16 1551  BP: (!) 114/98  Pulse: 97  Resp: (!) 22  Temp: 36.7 C    Last Pain:  Vitals:   05/31/16 1600  TempSrc:   PainSc: 10-Worst pain ever      Patients Stated Pain Goal: 2 (05/31/16 1600)  Complications: No apparent anesthesia complications

## 2016-05-31 NOTE — Brief Op Note (Signed)
05/31/2016  5:31 PM  PATIENT:  Logan Mckee  51 y.o. male  PRE-OPERATIVE DIAGNOSIS:  Right Ureteral Stone and Fever  POST-OPERATIVE DIAGNOSIS:  Right Ureteral Stone and Fever  PROCEDURE:  Procedure(s): CYSTOSCOPY WITH RETROGRADE PYELOGRAM/ RIGHT URETERAL STENT PLACEMENT (Right)  SURGEON:  Surgeon(s) and Role:    * Sebastian Acheheodore Beauregard Jarrells, MD - Primary  PHYSICIAN ASSISTANT:   ASSISTANTS: none   ANESTHESIA:   general  EBL:  No intake/output data recorded.  BLOOD ADMINISTERED:none  DRAINS: none   LOCAL MEDICATIONS USED:  NONE  SPECIMEN:  Source of Specimen:  Rt renal pelvis urine  DISPOSITION OF SPECIMEN:  Microbiology for gram stain and culture  COUNTS:  YES  TOURNIQUET:  * No tourniquets in log *  DICTATION: .Other Dictation: Dictation Number [id number]  PLAN OF CARE: Admit for overnight observation  PATIENT DISPOSITION:  PACU - hemodynamically stable.   Delay start of Pharmacological VTE agent (>24hrs) due to surgical blood loss or risk of bleeding: yes

## 2016-05-31 NOTE — Anesthesia Procedure Notes (Addendum)
Procedure Name: Intubation Date/Time: 05/31/2016 5:15 PM Performed by: Noralyn Pick D Pre-anesthesia Checklist: Patient identified, Emergency Drugs available, Suction available and Patient being monitored Patient Re-evaluated:Patient Re-evaluated prior to inductionOxygen Delivery Method: Circle system utilized Preoxygenation: Pre-oxygenation with 100% oxygen Intubation Type: IV induction, Rapid sequence and Cricoid Pressure applied Laryngoscope Size: Mac and 4 Grade View: Grade I Tube type: Oral Tube size: 7.5 mm Number of attempts: 1 Airway Equipment and Method: Stylet and Oral airway Placement Confirmation: ETT inserted through vocal cords under direct vision,  positive ETCO2 and breath sounds checked- equal and bilateral Secured at: 22 cm Tube secured with: Tape Dental Injury: Teeth and Oropharynx as per pre-operative assessment

## 2016-05-31 NOTE — H&P (Signed)
Logan Mckee is an 51 y.o. male.    Chief Complaint:  Pre-op RIGHT ureteral stent placement  HPI:   1 - Recurrent Nephrolithiasis -  Pre 2018- SWL x 1  05/2016 5mm Rt prox stone (with mild hydro and some perinephric stranding) + 5mm Rt renal x2 and punctate left renal (non-obstrucing) by CT on eval 1 day severe right flank pain and nausea with emesis. Unable to maintain hydration    PMH unremarkable. No CV disese / blood thinners. No current PCP. 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   Today "Logan Mckee" is seen to proceed with semi-urgent right ureteral stent placmeent for renal decompression in setting of refracotry colic and suspect early pyelonephritis. NO solid food since yesterday. Few sips water aroudn noon.    Past Medical History:  Diagnosis Date  . ED (erectile dysfunction)   . HSV-2 infection   . Kidney stones     Past Surgical History:  Procedure Laterality Date  . LITHOTRIPSY      Family History  Problem Relation Age of Onset  . Cancer Father [id number]    Thyroid Cancer   Social History:  reports that he has never smoked. He has never used smokeless tobacco. He reports that he drinks alcohol. He reports that he does not use drugs.  Allergies:  Allergies  Allergen Reactions  . Demerol [Meperidine] Rash    Medications Prior to Admission  Medication Sig Dispense Refill  . atorvastatin (LIPITOR) 40 MG tablet TAKE 1 TABLET (40 MG TOTAL) BY MOUTH DAILY. 90 tablet 2  . Cinnamon 500 MG TABS Take by mouth daily.    . COCONUT OIL PO Take by mouth.    . Flaxseed, Linseed, (FLAXSEED OIL) 1200 MG CAPS Take by mouth.    . Horse Chestnut (VENASTAT PO) Take by mouth.    . Magnesium 250 MG TABS Take by mouth.    . Menaquinone-7 (VITAMIN K2) 100 MCG CAPS Take by mouth.    . Multiple Vitamin (MULTIVITAMIN) tablet Take 1 tablet by mouth daily.    . Multiple Vitamins-Minerals (ICAPS PO) Take by mouth.    . Omega-3 Fatty Acids (FISH OIL PO) Take 1,200 mg by  mouth daily.    . sildenafil (REVATIO) 20 MG tablet Take 2-5 tablets as needed for sexual activity 50 tablet 6  . sildenafil (VIAGRA) 100 MG tablet Take 1 tablet (100 mg total) by mouth daily as needed for erectile dysfunction. 30 tablet 2  . valACYclovir (VALTREX) 1000 MG tablet Take 1 tablet (1,000 mg total) by mouth daily. 90 tablet 2  . valACYclovir (VALTREX) 1000 MG tablet TAKE 1 TABLET (1,000 MG TOTAL) BY MOUTH DAILY. 90 tablet 1  . VIAGRA 100 MG tablet TAKE 1 TABLET EVERY DAY AS NEEDED FOR ERECTILE DYSFUNCTION 30 tablet 1  . vitamin C (ASCORBIC ACID) 500 MG tablet Take 500 mg by mouth daily.    . vitamin E (VITAMIN E) 400 UNIT capsule Take 400 Units by mouth daily.      No results found for this or any previous visit (from the past 48 hour(s)). No results found.  Review of Systems  Constitutional: Positive for fever and malaise/fatigue.  HENT: Negative.   Eyes: Negative.   Respiratory: Negative.   Cardiovascular: Negative.   Gastrointestinal: Positive for nausea and vomiting.  Genitourinary: Positive for flank pain.  Skin: Negative.   Neurological: Negative.   Endo/Heme/Allergies: Negative.   Psychiatric/Behavioral: Negative.     Blood pressure (!) 114/98, pulse 97, temperature  98 F (36.7 C), temperature source Oral, resp. rate (!) 22, height 5\' 10"  (1.778 m), SpO2 100 %. Physical Exam  Constitutional: He appears well-developed.  HENT:  Head: Normocephalic.  Eyes: Pupils are equal, round, and reactive to light.  Neck: Normal range of motion.  Cardiovascular: Normal rate.   Respiratory: Effort normal.  GI: Soft.  Genitourinary:  Genitourinary Comments: Rt CVAT  Neurological: He is alert.  Skin: Skin is warm.  Psychiatric: He has a normal mood and affect. His behavior is normal. Thought content normal.     Assessment/Plan  Proceed as planned with cysto / right retrograde / right stent placement today. Goal is renal decompression. He will then undergo right  ureterosocpy in 1-2 weeks after clears infectious parameters. Likely admiit post-op for IVF / hydration / and verify no fever spikes.  Risks, benefits, alternatives, expected peri-op course discussed.   Logan Ache, MD 05/31/2016, 4:02 PM

## 2016-05-31 NOTE — Anesthesia Preprocedure Evaluation (Signed)
Anesthesia Evaluation  Patient identified by MRN, date of birth, ID band Patient awake    Reviewed: Allergy & Precautions, NPO status , Patient's Chart, lab work & pertinent test results  History of Anesthesia Complications Negative for: history of anesthetic complications  Airway Mallampati: II  TM Distance: >3 FB Neck ROM: Full    Dental  (+) Dental Advisory Given, Poor Dentition, Chipped   Pulmonary neg pulmonary ROS,    breath sounds clear to auscultation       Cardiovascular negative cardio ROS   Rhythm:Regular Rate:Normal     Neuro/Psych Anxiety negative neurological ROS     GI/Hepatic Neg liver ROS, Nausea and vomiting   Endo/Other  negative endocrine ROS  Renal/GU stones     Musculoskeletal   Abdominal   Peds  Hematology negative hematology ROS (+)   Anesthesia Other Findings   Reproductive/Obstetrics                             Anesthesia Physical Anesthesia Plan  ASA: II and emergent  Anesthesia Plan: General   Post-op Pain Management:    Induction: Intravenous and Rapid sequence  Airway Management Planned: Oral ETT  Additional Equipment:   Intra-op Plan:   Post-operative Plan: Extubation in OR  Informed Consent: I have reviewed the patients History and Physical, chart, labs and discussed the procedure including the risks, benefits and alternatives for the proposed anesthesia with the patient or authorized representative who has indicated his/her understanding and acceptance.   Dental advisory given  Plan Discussed with: CRNA and Surgeon  Anesthesia Plan Comments: (Plan routine monitors, GETA)        Anesthesia Quick Evaluation

## 2016-05-31 NOTE — Anesthesia Postprocedure Evaluation (Signed)
Anesthesia Post Note  Patient: Erin SonsWayne Sarr  Procedure(s) Performed: Procedure(s) (LRB): CYSTOSCOPY WITH RETROGRADE PYELOGRAM/ RIGHT URETERAL STENT PLACEMENT (Right)  Patient location during evaluation: PACU Anesthesia Type: General Level of consciousness: oriented, sedated and patient cooperative Pain management: pain level controlled Vital Signs Assessment: post-procedure vital signs reviewed and stable Respiratory status: spontaneous breathing, nonlabored ventilation and respiratory function stable Cardiovascular status: blood pressure returned to baseline and stable Postop Assessment: no signs of nausea or vomiting Anesthetic complications: no       Last Vitals:  Vitals:   05/31/16 1815 05/31/16 1830  BP: (!) 134/94 (!) 148/92  Pulse: 99 100  Resp: 19 18  Temp: 36.7 C 36.7 C    Last Pain:  Vitals:   05/31/16 1903  TempSrc:   PainSc: 8                  Yehonatan Grandison,E. Bryttany Tortorelli

## 2016-06-01 ENCOUNTER — Encounter (HOSPITAL_COMMUNITY): Payer: Self-pay | Admitting: Urology

## 2016-06-01 DIAGNOSIS — N132 Hydronephrosis with renal and ureteral calculous obstruction: Secondary | ICD-10-CM | POA: Diagnosis not present

## 2016-06-01 DIAGNOSIS — Z79899 Other long term (current) drug therapy: Secondary | ICD-10-CM | POA: Diagnosis not present

## 2016-06-01 MED ORDER — OXYCODONE-ACETAMINOPHEN 5-325 MG PO TABS: 1.0000 | ORAL_TABLET | Freq: Four times a day (QID) | ORAL | 0 refills | Status: DC | PRN

## 2016-06-01 MED ORDER — SENNOSIDES-DOCUSATE SODIUM 8.6-50 MG PO TABS: 1.0000 | ORAL_TABLET | Freq: Two times a day (BID) | ORAL | 0 refills | Status: DC

## 2016-06-01 MED ORDER — KETOROLAC TROMETHAMINE 10 MG PO TABS: 10.0000 mg | ORAL_TABLET | Freq: Four times a day (QID) | ORAL | 1 refills | Status: DC | PRN

## 2016-06-01 NOTE — Discharge Instructions (Signed)
1 - You may have urinary urgency (bladder spasms) and bloody urine on / off with stent in place. This is normal. ° °2 - Call MD or go to ER for fever >102, severe pain / nausea / vomiting not relieved by medications, or acute change in medical status ° °

## 2016-06-01 NOTE — Discharge Summary (Signed)
Physician Discharge Summary  Patient ID: Logan Mckee MRN: [id number] DOB/AGE: Dec 06, 1965 51 y.o.  Admit date: 05/31/2016 Discharge date: 06/01/2016  Admission Diagnoses: Right Ureteral Stone with malaise  Discharge Diagnoses:  Active Problems:   Ureteral stone with hydronephrosis   Discharged Condition: good  Hospital Course:   Pt underwent urgent cysto, right retrograde and stent placement for multifocal right ureteral / renal stones in setting of refractory colic and subjective fevers. Admitted for observation overnight. By the afternoon of POD 1, he is ambulatory, afebrile, pain controlled on PO meds, and felt to be adequate for discharge.   Consults: None  Significant Diagnostic Studies: none  Treatments: surgery:  cysto, right retrograde and stent placement 05/31/16  Discharge Exam: Blood pressure 135/82, pulse 86, temperature 97.9 F (36.6 C), temperature source Oral, resp. rate 18, height 5\' 10"  (1.778 m), weight 90.9 kg (200 lb 6.4 oz), SpO2 99 %. General appearance: alert, cooperative and family at bedside Eyes: negative Nose: Nares normal. Septum midline. Mucosa normal. No drainage or sinus tenderness. Throat: lips, mucosa, and tongue normal; teeth and gums normal Neck: supple, symmetrical, trachea midline Back: symmetric, no curvature. ROM normal. No CVA tenderness. Resp: no-labored on rom air.  Cardio: Nl rate GI: soft, non-tender; bowel sounds normal; no masses,  no organomegaly Extremities: extremities normal, atraumatic, no cyanosis or edema Pulses: 2+ and symmetric Skin: Skin color, texture, turgor normal. No rashes or lesions Lymph nodes: Cervical, supraclavicular, and axillary nodes normal. Neurologic: Grossly normal  Disposition: Final discharge disposition not confirmed   Allergies as of 06/01/2016      Reactions   Demerol [meperidine] Rash      Medication List    TAKE these medications   atorvastatin 40 MG tablet Commonly known as:   LIPITOR TAKE 1 TABLET (40 MG TOTAL) BY MOUTH DAILY.   CAYENNE PO Take 1 capsule by mouth daily.   Cinnamon 500 MG Tabs Take 1 tablet by mouth daily.   COCONUT OIL PO Take 1 capsule by mouth daily.   FISH OIL PO Take 1,200 mg by mouth daily.   Flaxseed Oil 1200 MG Caps Take 1 capsule by mouth daily.   ICAPS PO Take 1 capsule by mouth daily.   ketorolac 10 MG tablet Commonly known as:  TORADOL Take 1 tablet (10 mg total) by mouth every 6 (six) hours as needed for moderate pain (or stent discomfort).   Magnesium 250 MG Tabs Take 1 tablet by mouth daily.   multivitamin tablet Take 1 tablet by mouth daily.   oxyCODONE-acetaminophen 5-325 MG tablet Commonly known as:  ROXICET Take 1-2 tablets by mouth every 6 (six) hours as needed for severe pain.   senna-docusate 8.6-50 MG tablet Commonly known as:  Senokot-S Take 1 tablet by mouth 2 (two) times daily. While taking strong pain meds to prevent constipation.   sildenafil 100 MG tablet Commonly known as:  VIAGRA Take 1 tablet (100 mg total) by mouth daily as needed for erectile dysfunction.   VIAGRA 100 MG tablet Generic drug:  sildenafil TAKE 1 TABLET EVERY DAY AS NEEDED FOR ERECTILE DYSFUNCTION   sildenafil 20 MG tablet Commonly known as:  REVATIO Take 2-5 tablets as needed for sexual activity   valACYclovir 1000 MG tablet Commonly known as:  VALTREX Take 1 tablet (1,000 mg total) by mouth daily.   valACYclovir 1000 MG tablet Commonly known as:  VALTREX TAKE 1 TABLET (1,000 MG TOTAL) BY MOUTH DAILY.   VENASTAT PO Take 1 capsule by mouth daily.  vitamin C 500 MG tablet Commonly known as:  ASCORBIC ACID Take 500 mg by mouth daily.   vitamin E 400 UNIT capsule Generic drug:  vitamin E Take 400 Units by mouth daily.   Vitamin K2 100 MCG Caps Take 1 capsule by mouth daily.      Follow-up Information    Sebastian AcheMANNY, Wyolene Weimann, MD Follow up.   Specialty:  Urology Why:  Office will call to arrange elective  ureteroscopy. Contact information: [address] [place] [address] [phone]           Signed: Sebastian AcheMANNY, Avilyn Virtue 06/01/2016, 3:41 PM

## 2016-06-01 NOTE — Op Note (Signed)
NAME:  Logan Mckee, Logan Mckee                       ACCOUNT NO.:  MEDICAL RECORD NO.:  [id number]  LOCATION:                                 FACILITY:  PHYSICIAN:  Sebastian Ache, MD          DATE OF BIRTH:  DATE OF PROCEDURE: 05/31/2016                               OPERATIVE REPORT   PREOPERATIVE DIAGNOSIS:  Right ureteral stone with subjective fevers, refractory colic.  PROCEDURE: 1. Cystoscopy, right retrograde pyelogram and interpretation. 2. Insertion of right ureteral stent, 5 x 26 Polaris, no tether.  ESTIMATED BLOOD LOSS:  Nil.  COMPLICATION:  None.  SPECIMEN:  Right renal pelvis urine for Gram stain and culture.  FINDINGS: 1. Unremarkable urinary bladder. 2. Very mild hydronephrosis with a filling defect in proximal ureter    Consistent with known stone. 3. Successful placement of right ureteral stent, proximal end in renal     pelvis and distal end in urinary bladder.  INDICATION:  [name] is a 51 year old gentleman with history of 1 episode of prior nephrolithiasis on workup of colicky flank pain and refractory nausea and vomiting and malaise to have a right proximal ureteral stone with multifocal right intrarenal stones by office CT today.  Options were discussed for management including medical therapy versus shockwave lithotripsy versus ureteroscopy; however, given his malaise and subjective fevers, it was felt that urgent decompression with stenting would be most advantageous, followed by ureteroscopy in a staged fashion given his infectious parameters.  He wished to proceed. Informed consent was obtained and placed in the medical record.  PROCEDURE IN DETAIL:  The patient being, Logan Mckee, was verified. Procedure being right ureteral stent placement was confirmed.  Procedure was carried out.  Time-out was performed.  Intravenous antibiotics were administered.  General endotracheal anesthesia induced.  The patient was placed into a low lithotomy position.  Sterile  field was created by prepping and draping the patient's penis, perineum, proximal thighs using iodine.  Next, cystourethroscopy was performed using a rigid cystoscope with offset lens.  Inspection of the anterior and posterior urethra were unremarkable.  Inspection of the urinary bladder revealed no diverticula, calcifications, papillary lesions.  The right ureteral orifice was cannulated with a 6-French end-hole catheter and right retrograde pyelogram was obtained.  Right retrograde pyelogram demonstrated a single right ureter with single-system right kidney.  There was a filling defect in the proximal ureter known stone.  There was mild hydroureteronephrosis above this.  A 0.038 Zip wire was advanced at the level of the upper pole, over which a new 5 x 26 Polaris-type stent was placed using cystoscopic and fluoroscopic guidance.  Good proximal and distal deployment were noted.  Efflux of urine was seen around into the distal end of the stent.  Notably, a sample of the right renal pelvis urine was obtained via the open-ended catheter, labeled right renal pelvis, urine for Gram stain and culture.  It did not appear to be grossly purulent. Bladder was emptied per cystoscope.  Procedure then terminated.  The patient tolerated procedure well, there were no immediate periprocedural complications.  The patient was taken to the postanesthesia care unit  in stable condition.          ______________________________ Sebastian Acheheodore Merrilyn Legler, MD     TM/MEDQ  D:  05/31/2016  T:  05/31/2016  Job:  [id number]

## 2016-06-02 ENCOUNTER — Other Ambulatory Visit: Payer: Self-pay | Admitting: Urology

## 2016-06-02 ENCOUNTER — Encounter (HOSPITAL_BASED_OUTPATIENT_CLINIC_OR_DEPARTMENT_OTHER): Payer: Self-pay | Admitting: *Deleted

## 2016-06-02 LAB — URINE CULTURE: Culture: NO GROWTH

## 2016-06-07 ENCOUNTER — Encounter (HOSPITAL_BASED_OUTPATIENT_CLINIC_OR_DEPARTMENT_OTHER): Payer: Self-pay | Admitting: *Deleted

## 2016-06-07 NOTE — Progress Notes (Addendum)
NPO AFTER MN. ARRIVE AT 1030.  MAY TAKE PAIN RX AM DOS W/ SIPS OF WATER. CURRENT LAB RESULT IN CHART AND EPIC.  PT STATES IN A LOT OF PAIN, PAIN RX NOT WORKING.  TOLD PT CAN COME EARLIER TO GET PRE-OP STARTED.

## 2016-06-08 ENCOUNTER — Ambulatory Visit (HOSPITAL_BASED_OUTPATIENT_CLINIC_OR_DEPARTMENT_OTHER)
Admission: RE | Admit: 2016-06-08 | Discharge: 2016-06-08 | Disposition: A | Discharge status: Home / Self Care | Payer: BLUE CROSS/BLUE SHIELD | Source: Ambulatory Visit | Attending: Urology | Admitting: Urology

## 2016-06-08 ENCOUNTER — Encounter (HOSPITAL_BASED_OUTPATIENT_CLINIC_OR_DEPARTMENT_OTHER): Payer: Self-pay | Admitting: Anesthesiology

## 2016-06-08 ENCOUNTER — Encounter (HOSPITAL_BASED_OUTPATIENT_CLINIC_OR_DEPARTMENT_OTHER)
Admission: RE | Disposition: A | Discharge status: Home / Self Care | Payer: Self-pay | Source: Ambulatory Visit | Attending: Urology

## 2016-06-08 ENCOUNTER — Ambulatory Visit (HOSPITAL_BASED_OUTPATIENT_CLINIC_OR_DEPARTMENT_OTHER): Payer: BLUE CROSS/BLUE SHIELD | Admitting: Anesthesiology

## 2016-06-08 DIAGNOSIS — Z87442 Personal history of urinary calculi: Secondary | ICD-10-CM | POA: Insufficient documentation

## 2016-06-08 DIAGNOSIS — E785 Hyperlipidemia, unspecified: Secondary | ICD-10-CM | POA: Diagnosis not present

## 2016-06-08 DIAGNOSIS — N132 Hydronephrosis with renal and ureteral calculous obstruction: Secondary | ICD-10-CM | POA: Insufficient documentation

## 2016-06-08 DIAGNOSIS — N529 Male erectile dysfunction, unspecified: Secondary | ICD-10-CM | POA: Diagnosis not present

## 2016-06-08 DIAGNOSIS — N202 Calculus of kidney with calculus of ureter: Secondary | ICD-10-CM | POA: Diagnosis not present

## 2016-06-08 DIAGNOSIS — Z466 Encounter for fitting and adjustment of urinary device: Secondary | ICD-10-CM | POA: Diagnosis not present

## 2016-06-08 HISTORY — PX: CYSTOSCOPY/URETEROSCOPY/HOLMIUM LASER/STENT PLACEMENT: SHX6546

## 2016-06-08 HISTORY — DX: Hyperlipidemia, unspecified: E78.5

## 2016-06-08 HISTORY — DX: Calculus of kidney: N20.0

## 2016-06-08 HISTORY — DX: Unspecified abdominal pain: R10.9

## 2016-06-08 HISTORY — PX: HOLMIUM LASER APPLICATION: SHX5852

## 2016-06-08 HISTORY — DX: Calculus of ureter: N20.1

## 2016-06-08 SURGERY — CYSTOSCOPY/URETEROSCOPY/HOLMIUM LASER/STENT PLACEMENT
Anesthesia: General | Laterality: Right

## 2016-06-08 MED ORDER — PROPOFOL 10 MG/ML IV BOLUS
INTRAVENOUS | Status: DC | PRN
  Administered 2016-06-08: 250 mg via INTRAVENOUS

## 2016-06-08 MED ORDER — MIDAZOLAM HCL 2 MG/2ML IJ SOLN
INTRAMUSCULAR | Status: AC
  Filled 2016-06-08: qty 2

## 2016-06-08 MED ORDER — FENTANYL CITRATE (PF) 100 MCG/2ML IJ SOLN
INTRAMUSCULAR | Status: AC
  Filled 2016-06-08: qty 2

## 2016-06-08 MED ORDER — ONDANSETRON HCL 4 MG/2ML IJ SOLN
INTRAMUSCULAR | Status: DC | PRN
  Administered 2016-06-08: 4 mg via INTRAVENOUS

## 2016-06-08 MED ORDER — SODIUM CHLORIDE 0.9 % IV SOLN
INTRAVENOUS | Status: DC
  Administered 2016-06-08 (×2): via INTRAVENOUS
  Filled 2016-06-08: qty 1000

## 2016-06-08 MED ORDER — DEXTROSE 5 % IV SOLN
INTRAVENOUS | Status: DC | PRN
  Administered 2016-06-08: 2 g via INTRAVENOUS

## 2016-06-08 MED ORDER — CEFTRIAXONE SODIUM 2 G IJ SOLR
INTRAMUSCULAR | Status: AC
  Filled 2016-06-08: qty 2

## 2016-06-08 MED ORDER — KETOROLAC TROMETHAMINE 10 MG PO TABS: 10.0000 mg | ORAL_TABLET | Freq: Four times a day (QID) | ORAL | 1 refills | Status: DC | PRN

## 2016-06-08 MED ORDER — MIDAZOLAM HCL 5 MG/5ML IJ SOLN
INTRAMUSCULAR | Status: DC | PRN
  Administered 2016-06-08: 2 mg via INTRAVENOUS

## 2016-06-08 MED ORDER — FENTANYL CITRATE (PF) 100 MCG/2ML IJ SOLN
INTRAMUSCULAR | Status: DC | PRN
  Administered 2016-06-08 (×2): 50 ug via INTRAVENOUS
  Administered 2016-06-08: 100 ug via INTRAVENOUS

## 2016-06-08 MED ORDER — LIDOCAINE 2% (20 MG/ML) 5 ML SYRINGE
INTRAMUSCULAR | Status: DC | PRN
  Administered 2016-06-08: 60 mg via INTRAVENOUS

## 2016-06-08 MED ORDER — ONDANSETRON HCL 4 MG/2ML IJ SOLN
INTRAMUSCULAR | Status: AC
  Filled 2016-06-08: qty 2

## 2016-06-08 MED ORDER — OXYCODONE-ACETAMINOPHEN 5-325 MG PO TABS
1.0000 | ORAL_TABLET | Freq: Once | ORAL | Status: AC
  Administered 2016-06-08: 1 via ORAL
  Filled 2016-06-08: qty 1

## 2016-06-08 MED ORDER — CEPHALEXIN 500 MG PO CAPS: 500.0000 mg | ORAL_CAPSULE | Freq: Two times a day (BID) | ORAL | 0 refills | Status: DC

## 2016-06-08 MED ORDER — OXYCODONE-ACETAMINOPHEN 5-325 MG PO TABS: 1.0000 | ORAL_TABLET | Freq: Four times a day (QID) | ORAL | 0 refills | Status: DC | PRN

## 2016-06-08 MED ORDER — MENTHOL 3 MG MT LOZG
LOZENGE | OROMUCOSAL | Status: AC
  Filled 2016-06-08: qty 9

## 2016-06-08 MED ORDER — DEXTROSE 5 % IV SOLN
INTRAVENOUS | Status: AC
  Filled 2016-06-08: qty 50

## 2016-06-08 MED ORDER — IOHEXOL 300 MG/ML  SOLN
INTRAMUSCULAR | Status: DC | PRN
Start: 2016-06-08 — End: 2016-06-08
  Administered 2016-06-08: 10 mL

## 2016-06-08 MED ORDER — FENTANYL CITRATE (PF) 100 MCG/2ML IJ SOLN
25.0000 ug | INTRAMUSCULAR | Status: DC | PRN
Start: 2016-06-08 — End: 2016-06-08
  Administered 2016-06-08: 25 ug via INTRAVENOUS
  Administered 2016-06-08: 50 ug via INTRAVENOUS
  Filled 2016-06-08: qty 1

## 2016-06-08 MED ORDER — DEXAMETHASONE SODIUM PHOSPHATE 4 MG/ML IJ SOLN
INTRAMUSCULAR | Status: DC | PRN
  Administered 2016-06-08: 10 mg via INTRAVENOUS

## 2016-06-08 MED ORDER — OXYCODONE-ACETAMINOPHEN 5-325 MG PO TABS
ORAL_TABLET | ORAL | Status: AC
  Filled 2016-06-08: qty 1

## 2016-06-08 MED ORDER — SODIUM CHLORIDE 0.9 % IR SOLN
Status: DC | PRN
  Administered 2016-06-08: 4000 mL via INTRAVESICAL

## 2016-06-08 SURGICAL SUPPLY — 26 items
BAG DRAIN URO-CYSTO SKYTR STRL (DRAIN) ×3 IMPLANT
BASKET DAKOTA 1.9FR 11X120 (BASKET) IMPLANT
BASKET LASER NITINOL 1.9FR (BASKET) ×3 IMPLANT
BASKET ZERO TIP NITINOL 2.4FR (BASKET) IMPLANT
CATH INTERMIT  6FR 70CM (CATHETERS) ×3 IMPLANT
CLOTH BEACON ORANGE TIMEOUT ST (SAFETY) ×3 IMPLANT
FIBER LASER FLEXIVA 365 (UROLOGICAL SUPPLIES) IMPLANT
FIBER LASER TRAC TIP (UROLOGICAL SUPPLIES) IMPLANT
GLOVE BIO SURGEON STRL SZ7.5 (GLOVE) ×3 IMPLANT
GLOVE BIOGEL PI IND STRL 7.5 (GLOVE) ×4 IMPLANT
GLOVE BIOGEL PI INDICATOR 7.5 (GLOVE) ×2
GOWN STRL REUS W/TWL LRG LVL3 (GOWN DISPOSABLE) ×6 IMPLANT
GUIDEWIRE ANG ZIPWIRE 038X150 (WIRE) ×3 IMPLANT
GUIDEWIRE STR DUAL SENSOR (WIRE) ×3 IMPLANT
IV NS 1000ML (IV SOLUTION) ×1
IV NS 1000ML BAXH (IV SOLUTION) ×2 IMPLANT
IV NS IRRIG 3000ML ARTHROMATIC (IV SOLUTION) ×3 IMPLANT
KIT RM TURNOVER CYSTO AR (KITS) ×3 IMPLANT
MANIFOLD NEPTUNE II (INSTRUMENTS) ×3 IMPLANT
NS IRRIG 500ML POUR BTL (IV SOLUTION) ×3 IMPLANT
PACK CYSTO (CUSTOM PROCEDURE TRAY) ×3 IMPLANT
SHEATH ACCESS URETERAL 38CM (SHEATH) ×3 IMPLANT
STENT POLARIS 5FRX26 (STENTS) ×3 IMPLANT
SYRINGE 10CC LL (SYRINGE) ×3 IMPLANT
TUBE CONNECTING 12X1/4 (SUCTIONS) ×3 IMPLANT
TUBE FEEDING 8FR 16IN STR KANG (MISCELLANEOUS) ×3 IMPLANT

## 2016-06-08 NOTE — OR Nursing (Signed)
STONE FRAGMENTS SENT WITH DR. MANNY. Providence LaniusKEELA Maley Venezia RN

## 2016-06-08 NOTE — Anesthesia Procedure Notes (Signed)
Procedure Name: LMA Insertion Date/Time: 06/08/2016 12:26 PM Performed by: Tyrone NineSAUVE, Kaylei Frink F Pre-anesthesia Checklist: Patient identified Patient Re-evaluated:Patient Re-evaluated prior to inductionOxygen Delivery Method: Circle system utilized Preoxygenation: Pre-oxygenation with 100% oxygen Intubation Type: IV induction Ventilation: Mask ventilation without difficulty LMA: LMA inserted LMA Size: 4.0 Number of attempts: 1 Airway Equipment and Method: Oral airway Placement Confirmation: breath sounds checked- equal and bilateral,  CO2 detector and positive ETCO2 Tube secured with: Tape Dental Injury: Teeth and Oropharynx as per pre-operative assessment

## 2016-06-08 NOTE — Discharge Instructions (Signed)
1 - You may have urinary urgency (bladder spasms) and bloody urine on / off with stent in place. This is normal.  2 - Call MD or go to ER for fever >102, severe pain / nausea / vomiting not relieved by medications, or acute change in medical status.   Post Anesthesia Home Care Instructions  Activity: Get plenty of rest for the remainder of the day. A responsible individual must stay with you for 24 hours following the procedure.  For the next 24 hours, DO NOT: -Drive a car -Operate machinery -Drink alcoholic beverages -Take any medication unless instructed by your physician -Make any legal decisions or sign important papers.  Meals: Start with liquid foods such as gelatin or soup. Progress to regular foods as tolerated. Avoid greasy, spicy, heavy foods. If nausea and/or vomiting occur, drink only clear liquids until the nausea and/or vomiting subsides. Call your physician if vomiting continues.  Special Instructions/Symptoms: Your throat may feel dry or sore from the anesthesia or the breathing tube placed in your throat during surgery. If this causes discomfort, gargle with warm salt water. The discomfort should disappear within 24 hours.  If you had a scopolamine patch placed behind your ear for the management of post- operative nausea and/or vomiting:  1. The medication in the patch is effective for 72 hours, after which it should be removed.  Wrap patch in a tissue and discard in the trash. Wash hands thoroughly with soap and water. 2. You may remove the patch earlier than 72 hours if you experience unpleasant side effects which may include dry mouth, dizziness or visual disturbances. 3. Avoid touching the patch. Wash your hands with soap and water after contact with the patch.    [facility] [phone] Post Ureteroscopy With or Without Stent Instructions  Definitions:  Ureter: The duct that transports urine from the kidney to the bladder. Stent:   A plastic  hollow tube that is placed into the ureter, from the kidney to the                 bladder to prevent the ureter from swelling shut.  GENERAL INSTRUCTIONS:  Despite the fact that no skin incisions were used, the area around the ureter and bladder is raw and irritated. The stent is a foreign body which will further irritate the bladder wall. This irritation is manifested by increased frequency of urination, both day and night, and by an increase in the urge to urinate. In some, the urge to urinate is present almost always. Sometimes the urge is strong enough that you may not be able to stop yourself from urinating. The only real cure is to remove the stent and then give time for the bladder wall to heal which can't be done until the danger of the ureter swelling shut has passed, which varies.  You may see some blood in your urine while the stent is in place and a few days afterwards. Do not be alarmed, even if the urine was clear for a while. Get off your feet and drink lots of fluids until clearing occurs. If you start to pass clots or don't improve, call us.  DIET: You may return to your normal diet immediately. Because of the raw surface of your bladder, alcohol, spicy foods, acid type foods and drinks with caffeine may cause irritation or frequency and should be used in moderation. To keep your urine flowing freely and to avoid constipation, drink plenty of fluids during the day (   8-10 glasses ). Tip: Avoid cranberry juice because it is very acidic.  ACTIVITY: Your physical activity doesn't need to be restricted. However, if you are very active, you may see some blood in your urine. We suggest that you reduce your activity under these circumstances until the bleeding has stopped.  BOWELS: It is important to keep your bowels regular during the postoperative period. Straining with bowel movements can cause bleeding. A bowel movement every other day is reasonable. Use a mild laxative if needed, such  as Milk of Magnesia 2-3 tablespoons, or 2 Dulcolax tablets. Call if you continue to have problems. If you have been taking narcotics for pain, before, during or after your surgery, you may be constipated. Take a laxative if necessary.   MEDICATION: You should resume your pre-surgery medications unless told not to. In addition you will often be given an antibiotic to prevent infection. These should be taken as prescribed until the bottles are finished unless you are having an unusual reaction to one of the drugs.  PROBLEMS YOU SHOULD REPORT TO US:  Fevers over 100.5 Fahrenheit.  Heavy bleeding, or clots ( See above notes about blood in urine ).  Inability to urinate.  Drug reactions ( hives, rash, nausea, vomiting, diarrhea ).  Severe burning or pain with urination that is not improving.  FOLLOW-UP: You will need a follow-up appointment to monitor your progress. Call for this appointment at the number listed above. Usually the first appointment will be about three to fourteen days after your surgery.      

## 2016-06-08 NOTE — Anesthesia Postprocedure Evaluation (Signed)
Anesthesia Post Note  Patient: Logan Mckee  Procedure(s) Performed: Procedure(s) (LRB): CYSTOSCOPY RIGHT RETROGRADE WITH RIGHT URETEROSCOPY RIGHT STENT EXCHANGE (Right) HOLMIUM LASER APPLICATION  Patient location during evaluation: PACU Anesthesia Type: General Level of consciousness: awake and alert Pain management: pain level controlled Vital Signs Assessment: post-procedure vital signs reviewed and stable Respiratory status: spontaneous breathing, nonlabored ventilation, respiratory function stable and patient connected to nasal cannula oxygen Cardiovascular status: blood pressure returned to baseline and stable Postop Assessment: no signs of nausea or vomiting Anesthetic complications: no       Last Vitals:  Vitals:   06/08/16 1345 06/08/16 1400  BP: (!) 131/96 122/80  Pulse: 80 66  Resp: 18 (!) 0  Temp:      Last Pain:  Vitals:   06/08/16 1400  TempSrc:   PainSc: 8                  Kennieth RadFitzgerald, Vaudine Dutan E

## 2016-06-08 NOTE — Transfer of Care (Signed)
Immediate Anesthesia Transfer of Care Note  Patient: Logan Mckee  Procedure(s) Performed: Procedure(s): CYSTOSCOPY RIGHT RETROGRADE WITH RIGHT URETEROSCOPY RIGHT STENT EXCHANGE (Right) HOLMIUM LASER APPLICATION  Patient Location: PACU  Anesthesia Type:General  Level of Consciousness: awake, alert , oriented and patient cooperative  Airway & Oxygen Therapy: Patient Spontanous Breathing and Patient connected to face mask oxygen  Post-op Assessment: Report given to RN and Post -op Vital signs reviewed and stable  Post vital signs: Reviewed and stable  Last Vitals:  Vitals:   06/08/16 1044  BP: 117/78  Pulse: 72  Resp: 17  Temp: 36.8 C    Last Pain:  Vitals:   06/08/16 1044  TempSrc: Oral  PainSc:       Patients Stated Pain Goal: 8 (06/08/16 1018)  Complications: No apparent anesthesia complications

## 2016-06-08 NOTE — H&P (Signed)
Logan Mckee is an 51 y.o. male.    Chief Complaint: Pre-op Right Ureteroscopic Stone Manipulation  HPI:   1 - Recurrent Nephrolithiasis -  Pre 2018- SWL x 1  05/2016 5mm Rt prox stone (with mild hydro and some perinephric stranding) + 5mm Rt renal x2 and punctate left renal (non-obstrucing) by CT on eval 1 day severe right flank pain and nausea with emesis. Unable to maintain hydration --> Cysto and right ureteral stent placement 3/13.   PMH unremarkable. No CV disese / blood thinners. No current PCP. 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   Today "Logan Mckee" is seen to proceed with RIGHT ureteroscopic stone manipulation with goal of right side sotne free. Most recetn UCX negative. NO interval fevers.    Past Medical History:  Diagnosis Date  . Acute right flank pain   . ED (erectile dysfunction)   . HSV-2 infection   . Hyperlipidemia   . Nephrolithiasis    bilateral non-obstructive right x2 , left x1  per dr Berneice Heinrichmanny note (urologist) 05-31-2016  . Right ureteral stone     Past Surgical History:  Procedure Laterality Date  . CYSTOSCOPY W/ URETERAL STENT PLACEMENT Right 05/31/2016   Procedure: CYSTOSCOPY WITH RETROGRADE PYELOGRAM/ RIGHT URETERAL STENT PLACEMENT;  Surgeon: Sebastian Acheheodore Adalena Abdulla, MD;  Location: [facility];  Service: Urology;  Laterality: Right;  . EXTRACORPOREAL SHOCK WAVE LITHOTRIPSY Left 02/01/2010    Family History  Problem Relation Age of Onset  . Cancer Father [id number]    Thyroid Cancer   Social History:  reports that he has never smoked. He has never used smokeless tobacco. He reports that he drinks alcohol. He reports that he does not use drugs.  Allergies:  Allergies  Allergen Reactions  . Demerol [Meperidine] Rash    No prescriptions prior to admission.    No results found for this or any previous visit (from the past 48 hour(s)). No results found.  Review of Systems  Constitutional: Negative.  Negative for chills and fever.  HENT:  Negative.   Eyes: Negative.   Respiratory: Negative.   Cardiovascular: Negative.   Gastrointestinal: Negative.   Genitourinary: Positive for flank pain.  Skin: Negative.   Neurological: Negative.   Endo/Heme/Allergies: Negative.   Psychiatric/Behavioral: Negative.     There were no vitals taken for this visit. Physical Exam  Constitutional: He appears well-developed.  HENT:  Head: Normocephalic.  Eyes: Pupils are equal, round, and reactive to light.  Neck: Normal range of motion.  Cardiovascular: Normal rate.   Respiratory: Effort normal.  GI: Soft.  Genitourinary:  Genitourinary Comments: Moderate Rt CVAT  Musculoskeletal: Normal range of motion.  Neurological: He is alert.  Skin: Skin is warm.  Psychiatric: He has a normal mood and affect. His behavior is normal.     Assessment/Plan  1 - Recurrent Nephrolithiasis - proceed as planned with RIGHT ureteroscopic stone manipulation with goal of right side stone free. Risks, benefits, alternatives, expected peri-op course, neef for possible temporary ureteral stent discussed previously and reiterated today.   Sebastian AcheMANNY, Tasheena Wambolt, MD 06/08/2016, 6:36 AM

## 2016-06-08 NOTE — Anesthesia Preprocedure Evaluation (Addendum)
Anesthesia Evaluation  Patient identified by MRN, date of birth, ID band Patient awake    Reviewed: Allergy & Precautions, H&P , NPO status , Patient's Chart, lab work & pertinent test results  Airway Mallampati: II  TM Distance: >3 FB Neck ROM: Full    Dental no notable dental hx. (+) Teeth Intact, Dental Advisory Given   Pulmonary neg pulmonary ROS,    Pulmonary exam normal breath sounds clear to auscultation       Cardiovascular negative cardio ROS   Rhythm:Regular Rate:Normal     Neuro/Psych negative neurological ROS  negative psych ROS   GI/Hepatic negative GI ROS, Neg liver ROS,   Endo/Other  negative endocrine ROS  Renal/GU Renal disease  negative genitourinary   Musculoskeletal   Abdominal   Peds  Hematology negative hematology ROS (+)   Anesthesia Other Findings   Reproductive/Obstetrics negative OB ROS                            Anesthesia Physical Anesthesia Plan  ASA: II  Anesthesia Plan: General   Post-op Pain Management:    Induction: Intravenous  Airway Management Planned: LMA  Additional Equipment:   Intra-op Plan:   Post-operative Plan: Extubation in OR  Informed Consent: I have reviewed the patients History and Physical, chart, labs and discussed the procedure including the risks, benefits and alternatives for the proposed anesthesia with the patient or authorized representative who has indicated his/her understanding and acceptance.   Dental advisory given  Plan Discussed with: CRNA  Anesthesia Plan Comments:         Anesthesia Quick Evaluation

## 2016-06-08 NOTE — Brief Op Note (Signed)
06/08/2016  1:19 PM  PATIENT:  Erin SonsWayne Hagedorn  51 y.o. male  PRE-OPERATIVE DIAGNOSIS:  RIGHT URETERAL STONE  POST-OPERATIVE DIAGNOSIS:  RIGHT URETERAL STONE  PROCEDURE:  Procedure(s): CYSTOSCOPY RIGHT RETROGRADE WITH RIGHT URETEROSCOPY RIGHT STENT EXCHANGE (Right) HOLMIUM LASER APPLICATION  SURGEON:  Surgeon(s) and Role:    * Sebastian Acheheodore Khalia Gong, MD - Primary  PHYSICIAN ASSISTANT:   ASSISTANTS: none   ANESTHESIA:   general  EBL:  Total I/O In: 700 [I.V.:700] Out: 5 [Blood:5]  BLOOD ADMINISTERED:none  DRAINS: none   LOCAL MEDICATIONS USED:  NONE  SPECIMEN:  Source of Specimen:  Rt renal stone fragment  DISPOSITION OF SPECIMEN:  [facility] for compositional analysis  COUNTS:  YES  TOURNIQUET:  * No tourniquets in log *  DICTATION: .Other Dictation: Dictation Number [id number]  PLAN OF CARE: Discharge to home after PACU  PATIENT DISPOSITION:  PACU - hemodynamically stable.   Delay start of Pharmacological VTE agent (>24hrs) due to surgical blood loss or risk of bleeding: yes

## 2016-06-09 ENCOUNTER — Encounter (HOSPITAL_BASED_OUTPATIENT_CLINIC_OR_DEPARTMENT_OTHER): Payer: Self-pay | Admitting: Urology

## 2016-06-09 NOTE — Op Note (Signed)
NAME:  Logan Mckee, Logan Mckee                       ACCOUNT NO.:  MEDICAL RECORD NO.:  [id number]  LOCATION:                                 FACILITY:  PHYSICIAN:  Sebastian Acheheodore Alylah Blakney, MD          DATE OF BIRTH:  DATE OF PROCEDURE: 06/08/2016                               OPERATIVE REPORT   DIAGNOSES:  Right ureteral and renal stones, history of recent colic.  PROCEDURE: 1. Cystoscopy with right retrograde pyelogram and interpretation. 2. Right ureteroscopy with laser lithotripsy. 3. Exchange of right ureteral stent, 5 x 26 Polaris, no tether.  ESTIMATED BLOOD LOSS:  Nil.  COMPLICATIONS:  None.  SPECIMENS:  Right renal stone fragments for compositional analysis.  FINDINGS: 1. Multifocal right intrarenal stone x3, one representing prior     retrograde positioning of prior UPJ stone. 2. Complete resolution of all stone fragments larger than 1/3rd mm     following laser lithotripsy and extraction. 3. Relatively narrow caliber upper third ureter. 4. Successful replacement of right ureteral stent, proximal end in the     renal pelvis and distal end in the urinary bladder.  INDICATION:  Mr. Logan Mckee is a 51 year old gentleman, who was found on workup of severe right flank pain and malaise to have right proximal ureteral and right renal stone fragments several weeks ago.  At that time, he had subjective fevers and there was significant perirenal stranding.  This was concerning for possible early obstructive pyelonephritis.  Therefore, he underwent ureteral stenting with plan for clear infectious parameters and ureteroscopy in a staged fashion.  His final urine culture has been negative.  He has cleared his malaise.  He now presents for right ureteroscopic stent replacement with a goal of right-sided stone-free today.  Informed consent was obtained and placed in the medical record.  PROCEDURE IN DETAIL:  The patient being Logan Mckee verified.  Operation being right ureteroscopic stone manipulation  was confirmed.  Procedure was carried out.  A time-out was performed.  Intravenous antibiotics were administered.  General LMA anesthesia introduced.  The patient was placed into a low lithotomy position.  A sterile field was created by prepping and draping the patient's penis, perineum, and proximal thighs using iodine.  Next, cystourethroscopy was performed using a rigid cystoscope with offset lens.  Inspection of the anterior and posterior urethra was unremarkable.  Inspection of the urinary bladder revealed distal end of the ureteral stent in situ.  The left ureteral orifice was unremarkable.  No papillary lesions or calcifications noted.  The distal end of the stent was grasped proximal to the level of the urethral meatus, through which a 0.038 Zip wire was advanced to the level of the upper pole.  The stent was exchanged for an open-ended catheter and right retrograde pyelogram was obtained.  Right retrograde pyelogram demonstrated a single right ureter with a single-system right kidney.  There were 3 calcifications noted on the spot fluoroscopy.  No hydronephrosis or filling defects.  The Zip wire was once again advanced to the upper pole and set aside as a safety wire.  An 8-French feeding tube was placed in the urinary  bladder for pressure release.  Next, semi-rigid ureteroscopy performed in the distal two-thirds of the right ureter alongside a separate Sensor working wire. No mucosal abnormalities were found.  It was exchanged for a 12/14, 38- cm ureteral access sheath at the level of proximal ureter using continuous fluoroscopic guidance.  A flexible digital ureteroscopy was then performed using a single-channel flexible ureteroscope.  The proximal ureter was quite narrow caliber, but did accommodate ureteroscope with careful maneuvering.  There were no focal strictures. Inspection of the kidney including all calyces x3 revealed upper pole papillary tip calcification, upper  mid pole papillary tip calcification, and a free-floating lower pole stone, felt likely to represent retrograde positioning of prior UPJ stone.  Given the significant narrowing at the area of the proximal ureter, it was felt that a dusting technique would be most advantageous to avoid back and forth passage of the ureteroscope across the area of relative narrowing.  As soon as the lower pole stone was repositioned into an upper pole calyx using an Escape basket to allow better angulation, dusting technique was used of all stones beginning at settings of 0.3 joules and 30 hertz, which was able to ablate approximately 50% of the stone volume.  Next, a popcorn technique was used with settings of 0.6 joules and 30 hertz and following finally 1.8 joules and 10 hertz, fragmenting remaining pieces into 1/3rd mm or less in diameter.  Repeat inspection of the kidney including all calyces x3 revealed complete resolution of all stone fragments larger than 1/3rd mm.  A representative small area of stone was grasped with an Escape basket, removed, and set aside for compositional analysis.  Given dusting technique use, it was felt that an indwelling stenting would be warranted until followup.  As such, the access sheath was removed under continuous vision.  No mucosal abnormalities were found.  A new 5 x 26 Polaris stent was placed using cystoscopic and fluoroscopic guidance.  Good proximal and distal deployments were noted.  Bladder was emptied per cystoscope.  The procedure was then terminated.  The patient tolerated the procedure well.  There were no immediate periprocedural complications.  The patient was taken to the postanesthesia care unit in stable condition.          ______________________________ Sebastian Ache, MD     TM/MEDQ  D:  06/08/2016  T:  06/08/2016  Job:  [id number]

## 2016-06-13 DIAGNOSIS — N200 Calculus of kidney: Secondary | ICD-10-CM | POA: Diagnosis not present

## 2016-06-21 DIAGNOSIS — Z125 Encounter for screening for malignant neoplasm of prostate: Secondary | ICD-10-CM | POA: Diagnosis not present

## 2016-06-21 DIAGNOSIS — N5201 Erectile dysfunction due to arterial insufficiency: Secondary | ICD-10-CM | POA: Diagnosis not present

## 2016-06-21 DIAGNOSIS — N201 Calculus of ureter: Secondary | ICD-10-CM | POA: Diagnosis not present

## 2016-07-02 DIAGNOSIS — N200 Calculus of kidney: Secondary | ICD-10-CM | POA: Diagnosis not present

## 2016-07-03 DIAGNOSIS — N200 Calculus of kidney: Secondary | ICD-10-CM | POA: Diagnosis not present

## 2016-07-11 DIAGNOSIS — M25562 Pain in left knee: Secondary | ICD-10-CM | POA: Diagnosis not present

## 2016-07-20 DIAGNOSIS — M25562 Pain in left knee: Secondary | ICD-10-CM | POA: Diagnosis not present

## 2016-07-29 DIAGNOSIS — N5201 Erectile dysfunction due to arterial insufficiency: Secondary | ICD-10-CM | POA: Diagnosis not present

## 2016-07-29 DIAGNOSIS — E7253 Hyperoxaluria: Secondary | ICD-10-CM | POA: Diagnosis not present

## 2016-07-29 DIAGNOSIS — E213 Hyperparathyroidism, unspecified: Secondary | ICD-10-CM | POA: Diagnosis not present

## 2016-07-29 DIAGNOSIS — N201 Calculus of ureter: Secondary | ICD-10-CM | POA: Diagnosis not present

## 2016-11-16 ENCOUNTER — Other Ambulatory Visit: Payer: Self-pay | Admitting: Family Medicine

## 2016-12-01 ENCOUNTER — Encounter: Payer: Self-pay | Admitting: Family

## 2016-12-01 ENCOUNTER — Ambulatory Visit (INDEPENDENT_AMBULATORY_CARE_PROVIDER_SITE_OTHER): Payer: BLUE CROSS/BLUE SHIELD | Admitting: Family

## 2016-12-01 VITALS — BP 133/85 | HR 99 | Temp 97.8°F | Ht 70.0 in | Wt 204.6 lb

## 2016-12-01 DIAGNOSIS — Z0000 Encounter for general adult medical examination without abnormal findings: Secondary | ICD-10-CM | POA: Diagnosis not present

## 2016-12-01 DIAGNOSIS — N529 Male erectile dysfunction, unspecified: Secondary | ICD-10-CM

## 2016-12-01 DIAGNOSIS — Z1211 Encounter for screening for malignant neoplasm of colon: Secondary | ICD-10-CM

## 2016-12-01 DIAGNOSIS — A6000 Herpesviral infection of urogenital system, unspecified: Secondary | ICD-10-CM | POA: Diagnosis not present

## 2016-12-01 DIAGNOSIS — E785 Hyperlipidemia, unspecified: Secondary | ICD-10-CM

## 2016-12-01 DIAGNOSIS — E663 Overweight: Secondary | ICD-10-CM | POA: Diagnosis not present

## 2016-12-01 MED ORDER — SILDENAFIL CITRATE 100 MG PO TABS: ORAL_TABLET | ORAL | 3 refills | Status: DC

## 2016-12-01 MED ORDER — SILDENAFIL CITRATE 20 MG PO TABS: ORAL_TABLET | ORAL | 6 refills | Status: DC

## 2016-12-01 MED ORDER — VALACYCLOVIR HCL 1 G PO TABS: 1000.0000 mg | ORAL_TABLET | Freq: Two times a day (BID) | ORAL | 5 refills | Status: DC

## 2016-12-01 MED ORDER — ATORVASTATIN CALCIUM 40 MG PO TABS: 40.0000 mg | ORAL_TABLET | Freq: Every day | ORAL | 2 refills | Status: DC

## 2016-12-01 MED ORDER — VALACYCLOVIR HCL 1 G PO TABS: 1000.0000 mg | ORAL_TABLET | Freq: Two times a day (BID) | ORAL | 2 refills | Status: DC

## 2016-12-01 NOTE — Addendum Note (Signed)
Addended by: Jannifer RodneyHAWKS, Shabria Egley A on: 12/01/2016 03:35 PM   Modules accepted: Orders

## 2016-12-01 NOTE — Progress Notes (Signed)
Subjective:    Patient ID: Logan Mckee, male    DOB: 1965/08/22, 51 y.o.   MRN: [id number]  Pt presents to the office today for CPE. PT takes several herbal OTC medications and  medications for erectile dysfunction, hyperlipidemia,  and genital herpes. Pt states since starting on the valtrex he has not had an outbreak. Pt denies any headache, palpitations, SOB, or edema at this time. Hyperlipidemia  This is a chronic problem. The current episode started more than 1 year ago. The problem is uncontrolled. Recent lipid tests were reviewed and are high. Exacerbating diseases include obesity. Current antihyperlipidemic treatment includes statins. The current treatment provides moderate improvement of lipids. Risk factors for coronary artery disease include dyslipidemia, male sex, obesity and a sedentary lifestyle.      Review of Systems  All other systems reviewed and are negative.      Objective:   Physical Exam  Constitutional: He is oriented to person, place, and time. He appears well-developed and well-nourished. No distress.  HENT:  Head: Normocephalic.  Right Ear: External ear normal.  Left Ear: External ear normal.  Nose: Nose normal.  Mouth/Throat: Oropharynx is clear and moist.  Eyes: Pupils are equal, round, and reactive to light. Right eye exhibits no discharge. Left eye exhibits no discharge.  Neck: Normal range of motion. Neck supple. No thyromegaly present.  Cardiovascular: Normal rate, regular rhythm, normal heart sounds and intact distal pulses.   No murmur heard. Pulmonary/Chest: Effort normal and breath sounds normal. No respiratory distress. He has no wheezes.  Abdominal: Soft. Bowel sounds are normal. He exhibits no distension. There is no tenderness.  Musculoskeletal: Normal range of motion. He exhibits no edema or tenderness.  Neurological: He is alert and oriented to person, place, and time.  Skin: Skin is warm and dry. No rash noted. No erythema.  Psychiatric:  He has a normal mood and affect. His behavior is normal. Judgment and thought content normal.  Vitals reviewed.     BP 133/85   Pulse 99   Temp 97.8 F (36.6 C) (Oral)   Ht '5\' 10"'$  (1.778 m)   Wt 204 lb 9.6 oz (92.8 kg)   BMI 29.36 kg/m      Assessment & Plan:  1. Annual physical exam - CMP14+EGFR - Lipid panel - CBC with Differential/Platelet - TSH - VITAMIN D 25 Hydroxy (Vit-D Deficiency, Fractures) - PSA, total and free - Ambulatory referral to Gastroenterology  2. Genital herpes simplex type 2 -Will change valtrex to as needed for outbreak. Safe sex - CMP14+EGFR - CBC with Differential/Platelet - valACYclovir (VALTREX) 1000 MG tablet; Take 1 tablet (1,000 mg total) by mouth 2 (two) times daily.  Dispense: 20 tablet; Refill: 2  3. Erectile dysfunction, unspecified erectile dysfunction type Pt given two rx's, pt's insurance will cover 3 tablets and he will get the rest through [facility] - sildenafil (REVATIO) 20 MG tablet; Take 2-5 tablets as needed for sexual activity  Dispense: 50 tablet; Refill: 6 - sildenafil (VIAGRA) 100 MG tablet; TAKE 1 TABLET EVERY DAY AS NEEDED FOR ERECTILE DYSFUNCTION  Dispense: 4 tablet; Refill: 3 - CMP14+EGFR - CBC with Differential/Platelet  4. Hyperlipidemia, unspecified hyperlipidemia type - CMP14+EGFR - CBC with Differential/Platelet  5. Overweight (BMI 25.0-29.9) - CMP14+EGFR - CBC with Differential/Platelet  6. Colon cancer screening - CMP14+EGFR - CBC with Differential/Platelet - Ambulatory referral to Gastroenterology   Continue all meds Labs pending Health Maintenance reviewed Diet and exercise encouraged RTO 1 year   [place]  Lenna Gilford, [place]

## 2016-12-01 NOTE — Addendum Note (Signed)
Addended by: Jannifer RodneyHAWKS, Redell Bhandari A on: 12/01/2016 03:48 PM   Modules accepted: Orders

## 2016-12-01 NOTE — Patient Instructions (Addendum)

## 2016-12-02 LAB — CMP14+EGFR
ALBUMIN: 5 g/dL (ref 3.5–5.5)
ALT: 46 IU/L — AB (ref 0–44)
AST: 26 IU/L (ref 0–40)
Albumin/Globulin Ratio: 2.4 — ABNORMAL HIGH (ref 1.2–2.2)
Alkaline Phosphatase: 111 IU/L (ref 39–117)
BILIRUBIN TOTAL: 0.8 mg/dL (ref 0.0–1.2)
BUN / CREAT RATIO: 16 (ref 9–20)
BUN: 12 mg/dL (ref 6–24)
CALCIUM: 10.1 mg/dL (ref 8.7–10.2)
CHLORIDE: 102 mmol/L (ref 96–106)
CO2: 22 mmol/L (ref 20–29)
CREATININE: 0.77 mg/dL (ref 0.76–1.27)
GFR calc Af Amer: 122 mL/min/{1.73_m2} (ref >59)
GFR calc non Af Amer: 106 mL/min/{1.73_m2} (ref >59)
Globulin, Total: 2.1 g/dL (ref 1.5–4.5)
Glucose: 90 mg/dL (ref 65–99)
Potassium: 4.4 mmol/L (ref 3.5–5.2)
Sodium: 143 mmol/L (ref 134–144)
TOTAL PROTEIN: 7.1 g/dL (ref 6.0–8.5)

## 2016-12-02 LAB — CBC WITH DIFFERENTIAL/PLATELET
BASOS ABS: 0 10*3/uL (ref 0.0–0.2)
BASOS: 0 %
EOS (ABSOLUTE): 0.2 10*3/uL (ref 0.0–0.4)
Eos: 3 %
Hematocrit: 46.8 % (ref 37.5–51.0)
Hemoglobin: 15.5 g/dL (ref 13.0–17.7)
Immature Grans (Abs): 0 10*3/uL (ref 0.0–0.1)
Immature Granulocytes: 0 %
LYMPHS ABS: 2.7 10*3/uL (ref 0.7–3.1)
Lymphs: 33 %
MCH: 28.7 pg (ref 26.6–33.0)
MCHC: 33.1 g/dL (ref 31.5–35.7)
MCV: 87 fL (ref 79–97)
Monocytes Absolute: 0.5 10*3/uL (ref 0.1–0.9)
Monocytes: 6 %
NEUTROS ABS: 4.8 10*3/uL (ref 1.4–7.0)
Neutrophils: 58 %
PLATELETS: 295 10*3/uL (ref 150–379)
RBC: 5.4 x10E6/uL (ref 4.14–5.80)
RDW: 14.3 % (ref 12.3–15.4)
WBC: 8.2 10*3/uL (ref 3.4–10.8)

## 2016-12-02 LAB — LIPID PANEL
CHOLESTEROL TOTAL: 138 mg/dL (ref 100–199)
Chol/HDL Ratio: 3.1 ratio (ref 0.0–5.0)
HDL: 45 mg/dL (ref >39)
LDL CALC: 63 mg/dL (ref 0–99)
TRIGLYCERIDES: 151 mg/dL — AB (ref 0–149)
VLDL CHOLESTEROL CAL: 30 mg/dL (ref 5–40)

## 2016-12-02 LAB — VITAMIN D 25 HYDROXY (VIT D DEFICIENCY, FRACTURES): Vit D, 25-Hydroxy: 40.2 ng/mL (ref 30.0–100.0)

## 2016-12-02 LAB — PSA, TOTAL AND FREE
PSA FREE PCT: 16.7 %
PSA FREE: 0.2 ng/mL
Prostate Specific Ag, Serum: 1.2 ng/mL (ref 0.0–4.0)

## 2016-12-02 LAB — TSH: TSH: 2.24 u[IU]/mL (ref 0.450–4.500)

## 2017-01-16 ENCOUNTER — Encounter: Payer: Self-pay | Admitting: Family

## 2017-03-22 DIAGNOSIS — Z6830 Body mass index (BMI) 30.0-30.9, adult: Secondary | ICD-10-CM | POA: Diagnosis not present

## 2017-03-22 DIAGNOSIS — J209 Acute bronchitis, unspecified: Secondary | ICD-10-CM | POA: Diagnosis not present

## 2017-03-22 DIAGNOSIS — J329 Chronic sinusitis, unspecified: Secondary | ICD-10-CM | POA: Diagnosis not present

## 2017-03-22 DIAGNOSIS — R05 Cough: Secondary | ICD-10-CM | POA: Diagnosis not present

## 2017-12-22 DIAGNOSIS — J309 Allergic rhinitis, unspecified: Secondary | ICD-10-CM | POA: Diagnosis not present

## 2017-12-22 DIAGNOSIS — Z6831 Body mass index (BMI) 31.0-31.9, adult: Secondary | ICD-10-CM | POA: Diagnosis not present

## 2017-12-22 DIAGNOSIS — R05 Cough: Secondary | ICD-10-CM | POA: Diagnosis not present

## 2017-12-28 ENCOUNTER — Encounter: Payer: Self-pay | Admitting: Pediatrics

## 2017-12-28 ENCOUNTER — Ambulatory Visit: Payer: BLUE CROSS/BLUE SHIELD | Admitting: Pediatrics

## 2017-12-28 VITALS — BP 129/83 | HR 74 | Temp 97.0°F | Ht 70.0 in | Wt 225.4 lb

## 2017-12-28 DIAGNOSIS — K2970 Gastritis, unspecified, without bleeding: Secondary | ICD-10-CM

## 2017-12-28 DIAGNOSIS — J181 Lobar pneumonia, unspecified organism: Secondary | ICD-10-CM | POA: Diagnosis not present

## 2017-12-28 DIAGNOSIS — J189 Pneumonia, unspecified organism: Secondary | ICD-10-CM

## 2017-12-28 DIAGNOSIS — R109 Unspecified abdominal pain: Secondary | ICD-10-CM

## 2017-12-28 MED ORDER — AZITHROMYCIN 250 MG PO TABS: ORAL_TABLET | ORAL | 0 refills | Status: DC

## 2017-12-28 MED ORDER — SUCRALFATE 1 G PO TABS: 1.0000 g | ORAL_TABLET | Freq: Three times a day (TID) | ORAL | 0 refills | Status: DC

## 2017-12-28 MED ORDER — PANTOPRAZOLE SODIUM 40 MG PO TBEC: 40.0000 mg | DELAYED_RELEASE_TABLET | Freq: Every day | ORAL | 0 refills | Status: DC

## 2017-12-28 NOTE — Progress Notes (Signed)
  Subjective:   Patient ID: Logan Mckee, male    DOB: June 19, 1965, 52 y.o.   MRN: [id number] CC: Abdominal Pain (mid)  HPI: Logan Mckee is a 52 y.o. male    past three weeks has had URI symptoms that come and go. Five days ago seen at Urgent care, at the time had worsening facial pressure, treated for a cough and cold with tessalon perles, flonase, naproxen '500mg'$  BID. Within 24h of starting the medicines he started having stomach pain, feels irritated, hurts, nauseous. URI symptoms somewhat improved past 24h, stomach pain has continued. He stopped naproxen two days ago. Continues to have some cough. Stomach pain and cough keeping him awake at night. Points to umbilicus with where pain is worst.  Appetite has been fine, eating regular meals, feeling hungry. Normal stooling, no diarrhea or constipation. No fevers past few days.  Relevant past medical, surgical, family and social history reviewed. Allergies and medications reviewed and updated. Social History   Tobacco Use  Smoking Status Never Smoker  Smokeless Tobacco Never Used   ROS: Per HPI   Objective:    BP 129/83   Pulse 74   Temp (!) 97 F (36.1 C) (Oral)   Ht '5\' 10"'$  (1.778 m)   Wt 225 lb 6.4 oz (102.2 kg)   BMI 32.34 kg/m   Wt Readings from Last 3 Encounters:  12/28/17 225 lb 6.4 oz (102.2 kg)  12/01/16 204 lb 9.6 oz (92.8 kg)  06/08/16 207 lb (93.9 kg)    Gen: NAD, alert, cooperative with exam, NCAT EYES: EOMI, no conjunctival I njection, or no icterus ENT:  TMs pearly gray b/l, OP without erythema LYMPH: no cervical LAD CV: NRRR, normal S1/S2, no murmur, distal pulses 2+ b/l Resp: rales present LLL, no wheezes, normal WOB Abd: +BS, soft, NTND. no guarding or organomegaly Ext: No edema, warm Neuro: Alert and oriented, strength equal b/l UE and LE, coordination grossly normal MSK: normal muscle bulk  Assessment & Plan:  Logan Mckee was seen today for abdominal pain.  Diagnoses and all orders for this visit:  Community  acquired pneumonia of left lower lobe of lung ([place]) -     azithromycin (ZITHROMAX) 250 MG tablet; Take 2 the first day and then one each day after.  Gastritis without bleeding, unspecified chronicity, unspecified gastritis type Stop NSAIDs, start below. -     pantoprazole (PROTONIX) 40 MG tablet; Take 1 tablet (40 mg total) by mouth daily. -     sucralfate (CARAFATE) 1 g tablet; Take 1 tablet (1 g total) by mouth 4 (four) times daily -  with meals and at bedtime.  Stomach pain Benign exam, pain bothering him, causing him to miss work. Will get blood work. -     CBC with Differential/Platelet -     CMP14+EGFR -     Lipase   Follow up plan: As needed, return precautions discussed Assunta Found, MD [place]

## 2017-12-28 NOTE — Patient Instructions (Addendum)
Take pantoprazole 30 min before eating on an empty stomach. Avoid greasy, acidic foods such as tomato, pizza, tomato sauce, onions.  Stop all NSAIDs such as ibuprofen, advil, naproxen, or aleve because these can worsen stomach irritation.  OK to take acetaminophen/tylenol for aches, pain or fever.  If still with breakthrough stomach symptoms can take maalox, tums, or sucralfate, I sent the sucralfate into pharmacy.  Start antibiotic to treat pneumonia.   OK to continue to use nasal steroid spray as needed for nasal congestion, can also use normal saline or salt water nose spray. The sensimyst brand from [employer] of nasal steroid does not have the smell/taste that flonase has.   Take daily antihistamine such as claritin or zyrtec, generic is fine (cetirizine, loratadine).  If not improving over next week or for any worsening come back to be seen.   Food Choices for Gastroesophageal Reflux Disease, Adult When you have gastroesophageal reflux disease (GERD), the foods you eat and your eating habits are very important. Choosing the right foods can help ease your discomfort. What guidelines do I need to follow?  Choose fruits, vegetables, whole grains, and low-fat dairy products.  Choose low-fat meat, fish, and poultry.  Limit fats such as oils, salad dressings, butter, nuts, and avocado.  Keep a food diary. This helps you identify foods that cause symptoms.  Avoid foods that cause symptoms. These may be different for everyone.  Eat small meals often instead of 3 large meals a day.  Eat your meals slowly, in a place where you are relaxed.  Limit fried foods.  Cook foods using methods other than frying.  Avoid drinking alcohol.  Avoid drinking large amounts of liquids with your meals.  Avoid bending over or lying down until 2-3 hours after eating. What foods are not recommended? These are some foods and drinks that may make your symptoms worse: Vegetables Tomatoes. Tomato  juice. Tomato and spaghetti sauce. Chili peppers. Onion and garlic. Horseradish. Fruits Oranges, grapefruit, and lemon (fruit and juice). Meats High-fat meats, fish, and poultry. This includes hot dogs, ribs, ham, sausage, salami, and bacon. Dairy Whole milk and chocolate milk. Sour cream. Cream. Butter. Ice cream. Cream cheese. Drinks Coffee and tea. Bubbly (carbonated) drinks or energy drinks. Condiments Hot sauce. Barbecue sauce. Sweets/Desserts Chocolate and cocoa. Donuts. Peppermint and spearmint. Fats and Oils High-fat foods. This includes [place] fries and potato chips. Other Vinegar. Strong spices. This includes black pepper, white pepper, red pepper, cayenne, curry powder, cloves, ginger, and chili powder. The items listed above may not be a complete list of foods and drinks to avoid. Contact your dietitian for more information. This information is not intended to replace advice given to you by your health care provider. Make sure you discuss any questions you have with your health care provider. Document Released: 09/06/2011 Document Revised: 08/13/2015 Document Reviewed: 01/09/2013 Elsevier Interactive Patient Education  2017 [employer].

## 2017-12-29 LAB — CBC WITH DIFFERENTIAL/PLATELET
BASOS ABS: 0.1 10*3/uL (ref 0.0–0.2)
Basos: 1 %
EOS (ABSOLUTE): 0.4 10*3/uL (ref 0.0–0.4)
Eos: 4 %
Hematocrit: 44.8 % (ref 37.5–51.0)
Hemoglobin: 15 g/dL (ref 13.0–17.7)
IMMATURE GRANS (ABS): 0.1 10*3/uL (ref 0.0–0.1)
Immature Granulocytes: 1 %
Lymphocytes Absolute: 2.6 10*3/uL (ref 0.7–3.1)
Lymphs: 32 %
MCH: 28.7 pg (ref 26.6–33.0)
MCHC: 33.5 g/dL (ref 31.5–35.7)
MCV: 86 fL (ref 79–97)
Monocytes Absolute: 0.5 10*3/uL (ref 0.1–0.9)
Monocytes: 6 %
NEUTROS ABS: 4.5 10*3/uL (ref 1.4–7.0)
Neutrophils: 56 %
Platelets: 318 10*3/uL (ref 150–450)
RBC: 5.23 x10E6/uL (ref 4.14–5.80)
RDW: 13.9 % (ref 12.3–15.4)
WBC: 8.1 10*3/uL (ref 3.4–10.8)

## 2017-12-29 LAB — CMP14+EGFR
ALBUMIN: 4.4 g/dL (ref 3.5–5.5)
ALT: 31 IU/L (ref 0–44)
AST: 15 IU/L (ref 0–40)
Albumin/Globulin Ratio: 2 (ref 1.2–2.2)
Alkaline Phosphatase: 85 IU/L (ref 39–117)
BUN / CREAT RATIO: 16 (ref 9–20)
BUN: 12 mg/dL (ref 6–24)
Bilirubin Total: 0.5 mg/dL (ref 0.0–1.2)
CALCIUM: 9.4 mg/dL (ref 8.7–10.2)
CHLORIDE: 100 mmol/L (ref 96–106)
CO2: 23 mmol/L (ref 20–29)
CREATININE: 0.76 mg/dL (ref 0.76–1.27)
GFR, EST AFRICAN AMERICAN: 122 mL/min/{1.73_m2} (ref >59)
GFR, EST NON AFRICAN AMERICAN: 106 mL/min/{1.73_m2} (ref >59)
GLUCOSE: 100 mg/dL — AB (ref 65–99)
Globulin, Total: 2.2 g/dL (ref 1.5–4.5)
Potassium: 4 mmol/L (ref 3.5–5.2)
Sodium: 139 mmol/L (ref 134–144)
Total Protein: 6.6 g/dL (ref 6.0–8.5)

## 2017-12-29 LAB — LIPASE: Lipase: 35 U/L (ref 13–78)

## 2018-01-20 ENCOUNTER — Other Ambulatory Visit: Payer: Self-pay | Admitting: Pediatrics

## 2018-01-20 DIAGNOSIS — K2970 Gastritis, unspecified, without bleeding: Secondary | ICD-10-CM

## 2018-04-13 ENCOUNTER — Encounter: Payer: Self-pay | Admitting: Family

## 2018-04-13 ENCOUNTER — Ambulatory Visit (INDEPENDENT_AMBULATORY_CARE_PROVIDER_SITE_OTHER): Payer: BLUE CROSS/BLUE SHIELD

## 2018-04-13 ENCOUNTER — Ambulatory Visit: Payer: BLUE CROSS/BLUE SHIELD | Admitting: Family

## 2018-04-13 VITALS — BP 130/88 | HR 108 | Temp 97.0°F | Ht <= 58 in | Wt 223.2 lb

## 2018-04-13 DIAGNOSIS — R6889 Other general symptoms and signs: Secondary | ICD-10-CM | POA: Diagnosis not present

## 2018-04-13 DIAGNOSIS — J4541 Moderate persistent asthma with (acute) exacerbation: Secondary | ICD-10-CM

## 2018-04-13 DIAGNOSIS — R0602 Shortness of breath: Secondary | ICD-10-CM | POA: Diagnosis not present

## 2018-04-13 DIAGNOSIS — Z20828 Contact with and (suspected) exposure to other viral communicable diseases: Secondary | ICD-10-CM

## 2018-04-13 MED ORDER — PREDNISONE 10 MG (21) PO TBPK: ORAL_TABLET | ORAL | 0 refills | Status: DC

## 2018-04-13 MED ORDER — METHYLPREDNISOLONE ACETATE 80 MG/ML IJ SUSP
80.0000 mg | Freq: Once | INTRAMUSCULAR | Status: AC
  Administered 2018-04-13: 80 mg via INTRAMUSCULAR

## 2018-04-13 MED ORDER — ALBUTEROL SULFATE (2.5 MG/3ML) 0.083% IN NEBU: 2.5000 mg | INHALATION_SOLUTION | Freq: Four times a day (QID) | RESPIRATORY_TRACT | 1 refills | Status: DC | PRN

## 2018-04-13 MED ORDER — AZITHROMYCIN 250 MG PO TABS: ORAL_TABLET | ORAL | 0 refills | Status: DC

## 2018-04-13 MED ORDER — ALBUTEROL SULFATE HFA 108 (90 BASE) MCG/ACT IN AERS: 1.0000 | INHALATION_SPRAY | Freq: Four times a day (QID) | RESPIRATORY_TRACT | 2 refills | Status: DC | PRN

## 2018-04-13 NOTE — Patient Instructions (Signed)

## 2018-04-13 NOTE — Progress Notes (Signed)
Subjective:    Patient ID: Logan Mckee, male    DOB: 03/07/1966, 53 y.o.   MRN: [id number]  Chief Complaint  Patient presents with  . head and congestion  . Cough  . Chills  . Generalized Body Aches    Cough  This is a new problem. The current episode started in the past 7 days. The problem has been gradually worsening. The problem occurs every few minutes. The cough is productive of purulent sputum. Associated symptoms include chills, ear congestion (right), ear pain, a fever, headaches, myalgias, nasal congestion, postnasal drip, rhinorrhea, a sore throat, shortness of breath and wheezing. The symptoms are aggravated by lying down. He has tried rest and OTC cough suppressant for the symptoms. The treatment provided mild relief.      Review of Systems  Constitutional: Positive for chills and fever.  HENT: Positive for ear pain, postnasal drip, rhinorrhea and sore throat.   Respiratory: Positive for cough, shortness of breath and wheezing.   Musculoskeletal: Positive for myalgias.  Neurological: Positive for headaches.  All other systems reviewed and are negative.      Objective:   Physical Exam Vitals signs reviewed.  Constitutional:      General: He is not in acute distress.    Appearance: He is well-developed. He is ill-appearing.  HENT:     Head: Normocephalic.     Nose: Mucosal edema present.     Right Sinus: Maxillary sinus tenderness present.     Left Sinus: Maxillary sinus tenderness present.     Mouth/Throat:     Pharynx: Posterior oropharyngeal erythema present.  Eyes:     General:        Right eye: No discharge.        Left eye: No discharge.     Pupils: Pupils are equal, round, and reactive to light.  Neck:     Musculoskeletal: Normal range of motion and neck supple.     Thyroid: No thyromegaly.  Cardiovascular:     Rate and Rhythm: Normal rate and regular rhythm.     Heart sounds: Normal heart sounds. No murmur.  Pulmonary:     Effort: Pulmonary  effort is normal. No respiratory distress.     Breath sounds: Wheezing present.  Abdominal:     General: Bowel sounds are normal. There is no distension.     Palpations: Abdomen is soft.     Tenderness: There is no abdominal tenderness.  Musculoskeletal: Normal range of motion.        General: No tenderness.  Skin:    General: Skin is warm and dry.     Findings: No erythema or rash.  Neurological:     Mental Status: He is alert and oriented to person, place, and time.     Cranial Nerves: No cranial nerve deficit.     Deep Tendon Reflexes: Reflexes are normal and symmetric.  Psychiatric:        Behavior: Behavior normal.        Thought Content: Thought content normal.        Judgment: Judgment normal.     BP 130/88   Pulse (!) 108   Temp (!) 97 F (36.1 C) (Oral)   Ht 1' (0.305 m)   Wt 223 lb 3.2 oz (101.2 kg)   HC 70" (177.8 cm)   BMI 1089.77 kg/m      Assessment & Plan:  Logan Mckee comes in today with chief complaint of head and congestion; Cough; Chills; and Generalized  Body Aches   Diagnosis and orders addressed:  1. Flu-like symptoms - DG Chest 2 View; Future  2. Moderate persistent asthma with acute exacerbation - albuterol (PROVENTIL) (2.5 MG/3ML) 0.083% nebulizer solution; Take 3 mLs (2.5 mg total) by nebulization every 6 (six) hours as needed for wheezing or shortness of breath.  Dispense: 150 mL; Refill: 1 - albuterol (PROVENTIL HFA;VENTOLIN HFA) 108 (90 Base) MCG/ACT inhaler; Inhale 1 puff into the lungs every 6 (six) hours as needed for wheezing or shortness of breath.  Dispense: 6.7 g; Refill: 2 - DME Nebulizer machine - predniSONE (STERAPRED UNI-PAK 21 TAB) 10 MG (21) TBPK tablet; Use as directed  Dispense: 21 tablet; Refill: 0 - methylPREDNISolone acetate (DEPO-MEDROL) injection 80 mg - DG Chest 2 View; Future  3. Exposure to influenza - DG Chest 2 View; Future   Pt has been taking care of his girlfriend who tested positive for the influenza last  week. He states his symptoms started last week, but have worsen. We will give steroids to help with his asthma exacerbation.  Albuterol as needed RTO in 1 week to recheck    Jannifer Rodney, FNP

## 2018-08-27 ENCOUNTER — Other Ambulatory Visit: Payer: Self-pay | Admitting: Family

## 2018-08-27 DIAGNOSIS — N529 Male erectile dysfunction, unspecified: Secondary | ICD-10-CM

## 2019-08-02 ENCOUNTER — Encounter: Payer: Self-pay | Admitting: Family

## 2019-08-02 ENCOUNTER — Ambulatory Visit (INDEPENDENT_AMBULATORY_CARE_PROVIDER_SITE_OTHER): Payer: Self-pay | Admitting: Family

## 2019-08-02 DIAGNOSIS — M545 Low back pain, unspecified: Secondary | ICD-10-CM

## 2019-08-02 MED ORDER — DICLOFENAC SODIUM 75 MG PO TBEC: 75.0000 mg | DELAYED_RELEASE_TABLET | Freq: Two times a day (BID) | ORAL | 0 refills | Status: AC

## 2019-08-02 MED ORDER — BACLOFEN 10 MG PO TABS: 10.0000 mg | ORAL_TABLET | Freq: Three times a day (TID) | ORAL | 0 refills | Status: DC | PRN

## 2019-08-02 MED ORDER — PREDNISONE 10 MG (21) PO TBPK: ORAL_TABLET | ORAL | 0 refills | Status: DC

## 2019-08-02 NOTE — Progress Notes (Signed)
   Virtual Visit via telephone Note Due to COVID-19 pandemic this visit was conducted virtually. This visit type was conducted due to national recommendations for restrictions regarding the COVID-19 Pandemic (e.g. social distancing, sheltering in place) in an effort to limit this patient's exposure and mitigate transmission in our community. All issues noted in this document were discussed and addressed.  A physical exam was not performed with this format.  I connected with Logan Mckee on 08/02/19 at 1:47 pm by telephone and verified that I am speaking with the correct person using two identifiers. Logan Mckee is currently located at home and no one is currently with him during visit. The provider, Jannifer Rodney, FNP is located in their office at time of visit.  I discussed the limitations, risks, security and privacy concerns of performing an evaluation and management service by telephone and the availability of in person appointments. I also discussed with the patient that there may be a patient responsible charge related to this service. The patient expressed understanding and agreed to proceed.   History and Present Illness:  Back Pain This is a new problem. The current episode started 1 to 4 weeks ago. The problem occurs intermittently. The problem has been gradually worsening since onset. The pain is present in the lumbar spine. The quality of the pain is described as aching. The pain does not radiate. The pain is at a severity of 9/10. The pain is moderate. The symptoms are aggravated by standing. Pertinent negatives include no leg pain, numbness, paresis, tingling or weakness. Risk factors include sedentary lifestyle and obesity. He has tried nothing for the symptoms. The treatment provided no relief.      Review of Systems  Musculoskeletal: Positive for back pain.  Neurological: Negative for tingling, weakness and numbness.  All other systems reviewed and are  negative.    Observations/Objective: No SOB or distress noted  Assessment and Plan: 1. Acute left-sided low back pain without sciatica Rest Ice  ROM exercises  Sedation precautions  Take diclofenac BID with food, no other NSAID's RTO if symptoms worsen or do not improve  - diclofenac (VOLTAREN) 75 MG EC tablet; Take 1 tablet (75 mg total) by mouth 2 (two) times daily.  Dispense: 30 tablet; Refill: 0 - predniSONE (STERAPRED UNI-PAK 21 TAB) 10 MG (21) TBPK tablet; Use as directed  Dispense: 21 tablet; Refill: 0 - baclofen (LIORESAL) 10 MG tablet; Take 1 tablet (10 mg total) by mouth 3 (three) times daily as needed for muscle spasms.  Dispense: 30 each; Refill: 0     I discussed the assessment and treatment plan with the patient. The patient was provided an opportunity to ask questions and all were answered. The patient agreed with the plan and demonstrated an understanding of the instructions.   The patient was advised to call back or seek an in-person evaluation if the symptoms worsen or if the condition fails to improve as anticipated.  The above assessment and management plan was discussed with the patient. The patient verbalized understanding of and has agreed to the management plan. Patient is aware to call the clinic if symptoms persist or worsen. Patient is aware when to return to the clinic for a follow-up visit. Patient educated on when it is appropriate to go to the emergency department.   Time call ended: 2:00 pm     I provided 13 minutes of non-face-to-face time during this encounter.    Jannifer Rodney, FNP

## 2021-02-17 ENCOUNTER — Encounter: Payer: Self-pay | Admitting: Family Medicine

## 2021-02-17 ENCOUNTER — Ambulatory Visit (INDEPENDENT_AMBULATORY_CARE_PROVIDER_SITE_OTHER): Payer: BC Managed Care – PPO | Admitting: Family Medicine

## 2021-02-17 VITALS — BP 135/77 | HR 92 | Temp 97.7°F | Ht 70.5 in | Wt 242.4 lb

## 2021-02-17 DIAGNOSIS — J069 Acute upper respiratory infection, unspecified: Secondary | ICD-10-CM | POA: Diagnosis not present

## 2021-02-17 DIAGNOSIS — J0100 Acute maxillary sinusitis, unspecified: Secondary | ICD-10-CM | POA: Diagnosis not present

## 2021-02-17 LAB — VERITOR FLU A/B WAIVED
Influenza A: NEGATIVE
Influenza B: NEGATIVE

## 2021-02-17 MED ORDER — PSEUDOEPHEDRINE-GUAIFENESIN ER 120-1200 MG PO TB12: 1.0000 | ORAL_TABLET | Freq: Two times a day (BID) | ORAL | 0 refills | Status: DC

## 2021-02-17 MED ORDER — AMOXICILLIN-POT CLAVULANATE 875-125 MG PO TABS: 1.0000 | ORAL_TABLET | Freq: Two times a day (BID) | ORAL | 0 refills | Status: DC

## 2021-02-17 NOTE — Progress Notes (Signed)
Subjective:  Patient ID: Logan Mckee, male    DOB: [birth year]  Age: 55 y.o. MRN: [id number]  CC: Cough, Nasal Congestion, and Sore Throat   HPI Logan Mckee presents for onset yesterday of fatigue, congestion. Left face pain. Throat is tight. No fever. Achy all over last night. A bit dyspneic. Coughing a lot.   Depression screen [facility] 2/9 02/17/2021 04/13/2018 12/28/2017  Decreased Interest 0 0 0  Down, Depressed, Hopeless 0 0 0  PHQ - 2 Score 0 0 0    History Logan Mckee has a past medical history of Acute right flank pain, ED (erectile dysfunction), HSV-2 infection, Hyperlipidemia, Nephrolithiasis, and Right ureteral stone.   He has a past surgical history that includes Cystoscopy w/ ureteral stent placement (Right, 05/31/2016); Extracorporeal shock wave lithotripsy (Left, 02/01/2010); Cystoscopy/ureteroscopy/holmium laser/stent placement (Right, 06/08/2016); and Holmium laser application (06/08/2016).   His family history includes Cancer (age of onset: 59) in his father.He reports that he has never smoked. He has never used smokeless tobacco. He reports current alcohol use. He reports that he does not use drugs.    ROS Review of Systems  Constitutional:  Negative for activity change, appetite change, chills and fever.  HENT:  Positive for congestion, postnasal drip, rhinorrhea and sinus pressure. Negative for ear discharge, ear pain, hearing loss, nosebleeds, sneezing and trouble swallowing.   Respiratory:  Negative for chest tightness and shortness of breath.   Cardiovascular:  Negative for chest pain and palpitations.  Skin:  Negative for rash.   Objective:  BP 135/77   Pulse 92   Temp 97.7 F (36.5 C)   Ht 5' 10.5" (1.791 m)   Wt 242 lb 6.4 oz (110 kg)   SpO2 97%   BMI 34.29 kg/m   BP Readings from Last 3 Encounters:  02/17/21 135/77  04/13/18 130/88  12/28/17 129/83    Wt Readings from Last 3 Encounters:  02/17/21 242 lb 6.4 oz (110 kg)  04/13/18 223 lb 3.2 oz (101.2 kg)   12/28/17 225 lb 6.4 oz (102.2 kg)     Physical Exam Constitutional:      Appearance: He is well-developed.  HENT:     Head: Normocephalic and atraumatic.     Right Ear: Tympanic membrane and external ear normal. No decreased hearing noted.     Left Ear: Tympanic membrane and external ear normal. No decreased hearing noted.     Nose: Mucosal edema, congestion and rhinorrhea present.     Right Sinus: No frontal sinus tenderness.     Left Sinus: No frontal sinus tenderness.     Mouth/Throat:     Mouth: Mucous membranes are moist.     Pharynx: No oropharyngeal exudate or posterior oropharyngeal erythema.  Eyes:     Pupils: Pupils are equal, round, and reactive to light.  Neck:     Meningeal: Brudzinski's sign absent.  Pulmonary:     Effort: No respiratory distress.     Breath sounds: Normal breath sounds. No wheezing or rales.  Abdominal:     Palpations: Abdomen is soft.     Tenderness: There is no abdominal tenderness.  Lymphadenopathy:     Head:     Right side of head: No preauricular adenopathy.     Left side of head: No preauricular adenopathy.     Cervical:     Right cervical: No superficial cervical adenopathy.    Left cervical: No superficial cervical adenopathy.  Skin:    General: Skin is warm and dry.  Neurological:  Mental Status: He is alert and oriented to person, place, and time.    Results for orders placed or performed in visit on 02/17/21  Veritor Flu A/B Waived  Result Value Ref Range   Influenza A Negative Negative   Influenza B Negative Negative     Assessment & Plan:   Logan Mckee was seen today for cough, nasal congestion and sore throat.  Diagnoses and all orders for this visit:  Acute maxillary sinusitis, recurrence not specified  Upper respiratory infection with cough and congestion -     Cancel: Novel Coronavirus, NAA ([facility]) -     Veritor Flu A/B Waived -     Cancel: RSV Ag, EIA -     COVID-19, Flu A+B and RSV  Other orders -      amoxicillin-clavulanate (AUGMENTIN) 875-125 MG tablet; Take 1 tablet by mouth 2 (two) times daily. Take all of this medication -     Pseudoephedrine-Guaifenesin [id number] MG TB12; Take 1 tablet by mouth 2 (two) times daily. For congestion      I am having Logan Mckee start on amoxicillin-clavulanate and Pseudoephedrine-Guaifenesin. I am also having him maintain his Omega-3 Fatty Acids (FISH OIL PO), Multiple Vitamins-Minerals (ICAPS PO), Cinnamon, Magnesium, vitamin E, Horse Chestnut (VENASTAT PO), vitamin C, Flaxseed Oil, Vitamin K2, multivitamin, COCONUT OIL PO, (Capsicum, Cayenne, (CAYENNE PO)), Lysine, sildenafil, valACYclovir, atorvastatin, pantoprazole, fluticasone, diclofenac, predniSONE, and baclofen.  Allergies as of 02/17/2021       Reactions   Demerol [meperidine] Rash        Medication List        Accurate as of February 17, 2021  5:40 PM. If you have any questions, ask your nurse or doctor.          amoxicillin-clavulanate 875-125 MG tablet Commonly known as: AUGMENTIN Take 1 tablet by mouth 2 (two) times daily. Take all of this medication Started by: Mechele Claude, MD   atorvastatin 40 MG tablet Commonly known as: LIPITOR Take 1 tablet (40 mg total) by mouth daily.   baclofen 10 MG tablet Commonly known as: LIORESAL Take 1 tablet (10 mg total) by mouth 3 (three) times daily as needed for muscle spasms.   CAYENNE PO Take 1 capsule by mouth daily.   Cinnamon 500 MG Tabs Take 1 tablet by mouth daily.   COCONUT OIL PO Take 1 capsule by mouth daily.   diclofenac 75 MG EC tablet Commonly known as: VOLTAREN Take 1 tablet (75 mg total) by mouth 2 (two) times daily.   FISH OIL PO Take 1,200 mg by mouth daily.   Flaxseed Oil 1200 MG Caps Take 1 capsule by mouth daily.   fluticasone 50 MCG/ACT nasal spray Commonly known as: FLONASE 2 sprays by Each Nare route daily.   ICAPS PO Take 1 capsule by mouth daily.   Lysine 1000 MG Tabs Take by mouth.    Magnesium 250 MG Tabs Take 1 tablet by mouth daily.   multivitamin tablet Take 1 tablet by mouth daily.   pantoprazole 40 MG tablet Commonly known as: PROTONIX TAKE 1 TABLET BY MOUTH EVERY DAY   predniSONE 10 MG (21) Tbpk tablet Commonly known as: STERAPRED UNI-PAK 21 TAB Use as directed   Pseudoephedrine-Guaifenesin [id number] MG Tb12 Take 1 tablet by mouth 2 (two) times daily. For congestion Started by: Mechele Claude, MD   sildenafil 20 MG tablet Commonly known as: REVATIO Take 2-5 tablets as needed for sexual activity   valACYclovir 1000 MG tablet Commonly known as: VALTREX Take  1 tablet (1,000 mg total) by mouth 2 (two) times daily.   VENASTAT PO Take 1 capsule by mouth daily.   vitamin C 500 MG tablet Commonly known as: ASCORBIC ACID Take 500 mg by mouth daily.   vitamin E 180 MG (400 UNITS) capsule Take 400 Units by mouth daily.   Vitamin K2 100 MCG Caps Take 1 capsule by mouth daily.         Follow-up: Return if symptoms worsen or fail to improve.  Mechele Claude, M.D.

## 2021-02-18 LAB — NOVEL CORONAVIRUS, NAA: SARS-CoV-2, NAA: NOT DETECTED

## 2021-02-19 ENCOUNTER — Telehealth: Payer: BC Managed Care – PPO | Admitting: Family Medicine

## 2021-02-19 ENCOUNTER — Other Ambulatory Visit: Payer: Self-pay | Admitting: Family

## 2021-02-19 NOTE — Telephone Encounter (Signed)
Ok with me 

## 2021-02-21 NOTE — Telephone Encounter (Signed)
okay

## 2021-02-22 NOTE — Telephone Encounter (Signed)
Pcp changed messaged left for patient

## 2021-08-25 ENCOUNTER — Ambulatory Visit: Payer: BC Managed Care – PPO | Admitting: Nurse Practitioner

## 2021-08-25 ENCOUNTER — Encounter: Payer: Self-pay | Admitting: Nurse Practitioner

## 2021-08-25 VITALS — BP 148/90 | HR 94 | Temp 98.7°F | Ht 72.0 in | Wt 246.0 lb

## 2021-08-25 DIAGNOSIS — G4709 Other insomnia: Secondary | ICD-10-CM | POA: Diagnosis not present

## 2021-08-25 MED ORDER — TRAZODONE HCL 50 MG PO TABS: 25.0000 mg | ORAL_TABLET | Freq: Every evening | ORAL | 3 refills | Status: DC | PRN

## 2021-08-25 NOTE — Progress Notes (Signed)
Acute Office Visit  Subjective:     Patient ID: Logan Mckee, male    DOB: 11-19-1965, 56 y.o.   MRN: [id number]  Chief Complaint  Patient presents with   Fatigue   Insomnia    Insomnia Primary symptoms: fragmented sleep, difficulty falling asleep, malaise/fatigue.   The current episode started 1 to 4 weeks ago. The onset quality is undetermined. The problem occurs nightly. The problem has been gradually worsening since onset. The symptoms are aggravated by work stress. How many beverages per day that contain caffeine: 0 - 1.  Past treatments include medication (OTC). Typical bedtime:  Other.  How long after going to bed to you fall asleep: over an hour.   PMH includes: hypertension.  Prior diagnostic workup includes:  No prior workup.    Review of Systems  Constitutional:  Positive for malaise/fatigue.  HENT: Negative.    Eyes: Negative.   Respiratory: Negative.    Cardiovascular: Negative.   Genitourinary: Negative.   Skin:  Negative for rash.  Psychiatric/Behavioral:  The patient has insomnia.   All other systems reviewed and are negative.      Objective:    BP (!) 148/90   Pulse 94   Temp 98.7 F (37.1 C)   Ht 6' (1.829 m)   Wt 246 lb (111.6 kg)   SpO2 97%   BMI 33.36 kg/m  BP Readings from Last 3 Encounters:  08/25/21 (!) 148/90  02/17/21 135/77  04/13/18 130/88   Wt Readings from Last 3 Encounters:  08/25/21 246 lb (111.6 kg)  02/17/21 242 lb 6.4 oz (110 kg)  04/13/18 223 lb 3.2 oz (101.2 kg)      Physical Exam Vitals and nursing note reviewed.  Constitutional:      Appearance: Normal appearance. He is obese.  HENT:     Head: Normocephalic.     Right Ear: External ear normal.     Left Ear: External ear normal.     Nose: Nose normal.     Mouth/Throat:     Mouth: Mucous membranes are moist.  Eyes:     Conjunctiva/sclera: Conjunctivae normal.  Cardiovascular:     Rate and Rhythm: Normal rate and regular rhythm.     Pulses: Normal pulses.      Heart sounds: Normal heart sounds.  Pulmonary:     Effort: Pulmonary effort is normal.     Breath sounds: Normal breath sounds.  Abdominal:     General: Bowel sounds are normal.  Skin:    General: Skin is warm.     Findings: No rash.  Neurological:     General: No focal deficit present.     Mental Status: He is alert and oriented to person, place, and time.    No results found for any visits on 08/25/21.      Assessment & Plan:   Problem List Items Addressed This Visit       Other   Other insomnia - Primary    Symptoms of insomnia not well controlled.  I started patient on trazodone 25 mg tablet by mouth at bedtime and can be increased to 50 mg tablet by mouth at bedtime if symptoms are not controlled.  Patient knows to follow-up in the next few weeks.       Relevant Medications   traZODone (DESYREL) 50 MG tablet    Meds ordered this encounter  Medications   traZODone (DESYREL) 50 MG tablet    Sig: Take 0.5-1 tablets (25-50 mg total) by  mouth at bedtime as needed for sleep.    Dispense:  30 tablet    Refill:  3    Order Specific Question:   Supervising Provider    Answer:   Mechele Claude [phone]    Return if symptoms worsen or fail to improve.  Daryll Drown, NP

## 2021-08-25 NOTE — Patient Instructions (Signed)
Insomnia Insomnia is a sleep disorder that makes it difficult to fall asleep or stay asleep. Insomnia can cause fatigue, low energy, difficulty concentrating, mood swings, and poor performance at work or school. There are three different ways to classify insomnia: Difficulty falling asleep. Difficulty staying asleep. Waking up too early in the morning. Any type of insomnia can be long-term (chronic) or short-term (acute). Both are common. Short-term insomnia usually lasts for 3 months or less. Chronic insomnia occurs at least three times a week for longer than 3 months. What are the causes? Insomnia may be caused by another condition, situation, or substance, such as: Having certain mental health conditions, such as anxiety and depression. Using caffeine, alcohol, tobacco, or drugs. Having gastrointestinal conditions, such as gastroesophageal reflux disease (GERD). Having certain medical conditions. These include: Asthma. Alzheimer's disease. Stroke. Chronic pain. An overactive thyroid gland (hyperthyroidism). Other sleep disorders, such as restless legs syndrome and sleep apnea. Menopause. Sometimes, the cause of insomnia may not be known. What increases the risk? Risk factors for insomnia include: Gender. Females are affected more often than males. Age. Insomnia is more common as people get older. Stress and certain medical and mental health conditions. Lack of exercise. Having an irregular work schedule. This may include working night shifts and traveling between different time zones. What are the signs or symptoms? If you have insomnia, the main symptom is having trouble falling asleep or having trouble staying asleep. This may lead to other symptoms, such as: Feeling tired or having low energy. Feeling nervous about going to sleep. Not feeling rested in the morning. Having trouble concentrating. Feeling irritable, anxious, or depressed. How is this diagnosed? This condition  may be diagnosed based on: Your symptoms and medical history. Your health care provider may ask about: Your sleep habits. Any medical conditions you have. Your mental health. A physical exam. How is this treated? Treatment for insomnia depends on the cause. Treatment may focus on treating an underlying condition that is causing the insomnia. Treatment may also include: Medicines to help you sleep. Counseling or therapy. Lifestyle adjustments to help you sleep better. Follow these instructions at home: Eating and drinking  Limit or avoid alcohol, caffeinated beverages, and products that contain nicotine and tobacco, especially close to bedtime. These can disrupt your sleep. Do not eat a large meal or eat spicy foods right before bedtime. This can lead to digestive discomfort that can make it hard for you to sleep. Sleep habits  Keep a sleep diary to help you and your health care provider figure out what could be causing your insomnia. Write down: When you sleep. When you wake up during the night. How well you sleep and how rested you feel the next day. Any side effects of medicines you are taking. What you eat and drink. Make your bedroom a dark, comfortable place where it is easy to fall asleep. Put up shades or blackout curtains to block light from outside. Use a white noise machine to block noise. Keep the temperature cool. Limit screen use before bedtime. This includes: Not watching TV. Not using your smartphone, tablet, or computer. Stick to a routine that includes going to bed and waking up at the same times every day and night. This can help you fall asleep faster. Consider making a quiet activity, such as reading, part of your nighttime routine. Try to avoid taking naps during the day so that you sleep better at night. Get out of bed if you are still awake after   15 minutes of trying to sleep. Keep the lights down, but try reading or doing a quiet activity. When you feel  sleepy, go back to bed. General instructions Take over-the-counter and prescription medicines only as told by your health care provider. Exercise regularly as told by your health care provider. However, avoid exercising in the hours right before bedtime. Use relaxation techniques to manage stress. Ask your health care provider to suggest some techniques that may work well for you. These may include: Breathing exercises. Routines to release muscle tension. Visualizing peaceful scenes. Make sure that you drive carefully. Do not drive if you feel very sleepy. Keep all follow-up visits. This is important. Contact a health care provider if: You are tired throughout the day. You have trouble in your daily routine due to sleepiness. You continue to have sleep problems, or your sleep problems get worse. Get help right away if: You have thoughts about hurting yourself or someone else. Get help right away if you feel like you may hurt yourself or others, or have thoughts about taking your own life. Go to your nearest emergency room or: Call 911. Call the National Suicide Prevention Lifeline at 1-800-273-8255 or 988. This is open 24 hours a day. Text the Crisis Text Line at 741741. Summary Insomnia is a sleep disorder that makes it difficult to fall asleep or stay asleep. Insomnia can be long-term (chronic) or short-term (acute). Treatment for insomnia depends on the cause. Treatment may focus on treating an underlying condition that is causing the insomnia. Keep a sleep diary to help you and your health care provider figure out what could be causing your insomnia. This information is not intended to replace advice given to you by your health care provider. Make sure you discuss any questions you have with your health care provider. Document Revised: 02/15/2021 Document Reviewed: 02/15/2021 Elsevier Patient Education  2023 [employer]  

## 2021-08-25 NOTE — Assessment & Plan Note (Signed)
Symptoms of insomnia not well controlled.  I started patient on trazodone 25 mg tablet by mouth at bedtime and can be increased to 50 mg tablet by mouth at bedtime if symptoms are not controlled.  Patient knows to follow-up in the next few weeks.

## 2021-09-16 ENCOUNTER — Other Ambulatory Visit: Payer: Self-pay | Admitting: Nurse Practitioner

## 2021-09-16 DIAGNOSIS — G4709 Other insomnia: Secondary | ICD-10-CM

## 2021-09-20 ENCOUNTER — Ambulatory Visit (INDEPENDENT_AMBULATORY_CARE_PROVIDER_SITE_OTHER): Payer: BC Managed Care – PPO | Admitting: Nurse Practitioner

## 2021-09-20 ENCOUNTER — Telehealth: Payer: Self-pay

## 2021-09-20 ENCOUNTER — Encounter: Payer: Self-pay | Admitting: Nurse Practitioner

## 2021-09-20 VITALS — BP 145/91 | HR 88 | Temp 98.6°F | Ht 72.0 in | Wt 248.0 lb

## 2021-09-20 DIAGNOSIS — Z0001 Encounter for general adult medical examination with abnormal findings: Secondary | ICD-10-CM | POA: Diagnosis not present

## 2021-09-20 DIAGNOSIS — G4709 Other insomnia: Secondary | ICD-10-CM | POA: Diagnosis not present

## 2021-09-20 DIAGNOSIS — Z0000 Encounter for general adult medical examination without abnormal findings: Secondary | ICD-10-CM | POA: Diagnosis not present

## 2021-09-20 DIAGNOSIS — N529 Male erectile dysfunction, unspecified: Secondary | ICD-10-CM | POA: Diagnosis not present

## 2021-09-20 DIAGNOSIS — Z136 Encounter for screening for cardiovascular disorders: Secondary | ICD-10-CM | POA: Diagnosis not present

## 2021-09-20 DIAGNOSIS — Z125 Encounter for screening for malignant neoplasm of prostate: Secondary | ICD-10-CM | POA: Diagnosis not present

## 2021-09-20 MED ORDER — ZOLPIDEM TARTRATE ER 6.25 MG PO TBCR: 6.2500 mg | EXTENDED_RELEASE_TABLET | Freq: Every evening | ORAL | 0 refills | Status: DC | PRN

## 2021-09-20 MED ORDER — SILDENAFIL CITRATE 100 MG PO TABS: ORAL_TABLET | ORAL | 3 refills | Status: DC

## 2021-09-20 NOTE — Telephone Encounter (Signed)
Pt stopped by nurses desk on his way out and was concerned about his sleep medication. He wanted me to ask you if an alternative could possibly be send in for pt since he states trazadone is not working well for him

## 2021-09-20 NOTE — Progress Notes (Signed)
Established Patient Office Visit  Subjective   Patient ID: Logan Mckee, male    DOB: 05-21-1965  Age: 56 y.o. MRN: [id number]  Chief Complaint  Patient presents with   Annual Exam    cpe    HPI   .   Encounter for general adult medical examination  Physical: Patient's last physical exam was 1 year ago .  Weight: Appropriate for height (BMI greater than than 27%) ;  Blood Pressure: Normal (BP greater than 120/80) ;  Medical History: Patient history reviewed ; Family history reviewed ;  Allergies Reviewed: No change in current allergies ;  Medications Reviewed: Medications reviewed - no changes ;  Lipids: Normal lipid levels ; labs completed results pending Smoking: Life-long non-smoker  Physical Activity: Exercises at least 3 times per week ;  Alcohol/Drug Use: Is a non-drinker ; No illicit drug use ;  Patient is not afflicted from Stress Incontinence and Urge Incontinence  Safety: reviewed ; Patient wears a seat belt, has smoke detectors, has carbon monoxide detectors, practices appropriate gun safety, and wears sunscreen with extended sun exposure. Dental Care: biannual cleanings, brushes and flosses daily. Ophthalmology/Optometry: Annual visit.  Hearing loss: none Vision impairments: none    Patient continues to report uncontrolled insomnia.  On patient's last visit patient was started on trazodone 50 mg tablet by mouth daily.  Patient is reporting taking 300 mg tablet of trazodone at hour of sleep and still not getting up to 5 hours of sleep daily.  Review of Systems  Constitutional: Negative.   HENT: Negative.    Eyes: Negative.   Respiratory: Negative.    Cardiovascular: Negative.   Genitourinary: Negative.   Musculoskeletal: Negative.   Skin: Negative.   Neurological: Negative.   All other systems reviewed and are negative.     Objective:     BP (!) 145/91   Pulse 88   Temp 98.6 F (37 C)   Ht 6' (1.829 m)   Wt 248 lb (112.5 kg)   SpO2 97%   BMI  33.63 kg/m  BP Readings from Last 3 Encounters:  09/20/21 (!) 145/91  08/25/21 (!) 148/90  02/17/21 135/77   Wt Readings from Last 3 Encounters:  09/20/21 248 lb (112.5 kg)  08/25/21 246 lb (111.6 kg)  02/17/21 242 lb 6.4 oz (110 kg)      Physical Exam Vitals and nursing note reviewed.  Constitutional:      Appearance: Normal appearance. He is obese.  HENT:     Head: Normocephalic and atraumatic.     Right Ear: External ear normal.     Left Ear: External ear normal.     Nose: No congestion.     Mouth/Throat:     Mouth: Mucous membranes are moist.     Pharynx: Oropharynx is clear.  Eyes:     Conjunctiva/sclera: Conjunctivae normal.  Cardiovascular:     Rate and Rhythm: Normal rate and regular rhythm.     Pulses: Normal pulses.     Heart sounds: Normal heart sounds.  Pulmonary:     Effort: Pulmonary effort is normal.     Breath sounds: Normal breath sounds.  Abdominal:     General: Bowel sounds are normal.  Neurological:     Mental Status: He is alert.      No results found for any visits on 09/20/21.  Last CBC Lab Results  Component Value Date   WBC 8.1 12/28/2017   HGB 15.0 12/28/2017   HCT 44.8 12/28/2017  MCV 86 12/28/2017   MCH 28.7 12/28/2017   RDW 13.9 12/28/2017   PLT 318 28/78/6767   Last metabolic panel Lab Results  Component Value Date   GLUCOSE 100 (H) 12/28/2017   NA 139 12/28/2017   K 4.0 12/28/2017   CL 100 12/28/2017   CO2 23 12/28/2017   BUN 12 12/28/2017   CREATININE 0.76 12/28/2017   GFRNONAA 106 12/28/2017   CALCIUM 9.4 12/28/2017   PROT 6.6 12/28/2017   ALBUMIN 4.4 12/28/2017   LABGLOB 2.2 12/28/2017   AGRATIO 2.0 12/28/2017   BILITOT 0.5 12/28/2017   ALKPHOS 85 12/28/2017   AST 15 12/28/2017   ALT 31 12/28/2017   Last lipids Lab Results  Component Value Date   CHOL 138 12/01/2016   HDL 45 12/01/2016   LDLCALC 63 12/01/2016   TRIG 151 (H) 12/01/2016   CHOLHDL 3.1 12/01/2016   Last hemoglobin A1c No results  found for: "HGBA1C" Last thyroid functions Lab Results  Component Value Date   TSH 2.240 12/01/2016   T4TOTAL 7.7 10/10/2014   Last vitamin D Lab Results  Component Value Date   VD25OH 40.2 12/01/2016   Last vitamin B12 and Folate No results found for: "VITAMINB12", "FOLATE"    The ASCVD Risk score (Arnett DK, et al., 2019) failed to calculate for the following reasons:   Cannot find a previous HDL lab   Cannot find a previous total cholesterol lab    Assessment & Plan:   Problem List Items Addressed This Visit       Other   Erectile dysfunction    Patient is reporting symptoms of erectile dysfunction.  This is not new for patient.  He was on Viagra and discontinued it few years ago but wants to get back on medication.  Patient is having problems getting erection/sustaining an erection.  Completed labs results pending      Relevant Medications   sildenafil (VIAGRA) 100 MG tablet   Other insomnia    Insomnia not well controlled on trazodone 50 mg tablet by mouth at hour of sleep.  Patient is currently taking trazodone 300 mg tablet by mouth at bedtime without 4 hours of sleep per night.  I advised patient not to increase medication dose without contacting provider due to side effects that may be associated with high doses of medication.  Patient aware that if he does well on Ambien he will need a drug screen for controlled substance.  Started patient on Ambien if patient does well on current medication 6.25 mg tablet by mouth daily.  Follow-up with unresolved symptoms.      Relevant Medications   zolpidem (AMBIEN CR) 6.25 MG CR tablet   Other Visit Diagnoses     Annual physical exam    -  Primary   Relevant Orders   CMP14+EGFR   CBC with Differential/Platelet   TSH   Lipid panel   PSA, total and free       Return in about 1 year (around 09/21/2022) for Annual physical.    Ivy Lynn, NP

## 2021-09-20 NOTE — Telephone Encounter (Signed)
We addressed that during his visit.

## 2021-09-20 NOTE — Assessment & Plan Note (Signed)
Patient is reporting symptoms of erectile dysfunction.  This is not new for patient.  He was on Viagra and discontinued it few years ago but wants to get back on medication.  Patient is having problems getting erection/sustaining an erection.  Completed labs results pending

## 2021-09-20 NOTE — Patient Instructions (Signed)
Health Maintenance, Male Adopting a healthy lifestyle and getting preventive care are important in promoting health and wellness. Ask your health care provider about: The right schedule for you to have regular tests and exams. Things you can do on your own to prevent diseases and keep yourself healthy. What should I know about diet, weight, and exercise? Eat a healthy diet  Eat a diet that includes plenty of vegetables, fruits, low-fat dairy products, and lean protein. Do not eat a lot of foods that are high in solid fats, added sugars, or sodium. Maintain a healthy weight Body mass index (BMI) is a measurement that can be used to identify possible weight problems. It estimates body fat based on height and weight. Your health care provider can help determine your BMI and help you achieve or maintain a healthy weight. Get regular exercise Get regular exercise. This is one of the most important things you can do for your health. Most adults should: Exercise for at least 150 minutes each week. The exercise should increase your heart rate and make you sweat (moderate-intensity exercise). Do strengthening exercises at least twice a week. This is in addition to the moderate-intensity exercise. Spend less time sitting. Even light physical activity can be beneficial. Watch cholesterol and blood lipids Have your blood tested for lipids and cholesterol at 56 years of age, then have this test every 5 years. You may need to have your cholesterol levels checked more often if: Your lipid or cholesterol levels are high. You are older than 56 years of age. You are at high risk for heart disease. What should I know about cancer screening? Many types of cancers can be detected early and may often be prevented. Depending on your health history and family history, you may need to have cancer screening at various ages. This may include screening for: Colorectal cancer. Prostate cancer. Skin cancer. Lung  cancer. What should I know about heart disease, diabetes, and high blood pressure? Blood pressure and heart disease High blood pressure causes heart disease and increases the risk of stroke. This is more likely to develop in people who have high blood pressure readings or are overweight. Talk with your health care provider about your target blood pressure readings. Have your blood pressure checked: Every 3-5 years if you are 18-39 years of age. Every year if you are 40 years old or older. If you are between the ages of 65 and 75 and are a current or former smoker, ask your health care provider if you should have a one-time screening for abdominal aortic aneurysm (AAA). Diabetes Have regular diabetes screenings. This checks your fasting blood sugar level. Have the screening done: Once every three years after age 45 if you are at a normal weight and have a low risk for diabetes. More often and at a younger age if you are overweight or have a high risk for diabetes. What should I know about preventing infection? Hepatitis B If you have a higher risk for hepatitis B, you should be screened for this virus. Talk with your health care provider to find out if you are at risk for hepatitis B infection. Hepatitis C Blood testing is recommended for: Everyone born from 1945 through 1965. Anyone with known risk factors for hepatitis C. Sexually transmitted infections (STIs) You should be screened each year for STIs, including gonorrhea and chlamydia, if: You are sexually active and are younger than 56 years of age. You are older than 56 years of age and your   health care provider tells you that you are at risk for this type of infection. Your sexual activity has changed since you were last screened, and you are at increased risk for chlamydia or gonorrhea. Ask your health care provider if you are at risk. Ask your health care provider about whether you are at high risk for HIV. Your health care provider  may recommend a prescription medicine to help prevent HIV infection. If you choose to take medicine to prevent HIV, you should first get tested for HIV. You should then be tested every 3 months for as long as you are taking the medicine. Follow these instructions at home: Alcohol use Do not drink alcohol if your health care provider tells you not to drink. If you drink alcohol: Limit how much you have to 0-2 drinks a day. Know how much alcohol is in your drink. In the [place] one drink equals one 12 oz bottle of beer (355 mL), one 5 oz glass of wine (148 mL), or one 1 oz glass of hard liquor (44 mL). Lifestyle Do not use any products that contain nicotine or tobacco. These products include cigarettes, chewing tobacco, and vaping devices, such as e-cigarettes. If you need help quitting, ask your health care provider. Do not use street drugs. Do not share needles. Ask your health care provider for help if you need support or information about quitting drugs. General instructions Schedule regular health, dental, and eye exams. Stay current with your vaccines. Tell your health care provider if: You often feel depressed. You have ever been abused or do not feel safe at home. Summary Adopting a healthy lifestyle and getting preventive care are important in promoting health and wellness. Follow your health care provider's instructions about healthy diet, exercising, and getting tested or screened for diseases. Follow your health care provider's instructions on monitoring your cholesterol and blood pressure. This information is not intended to replace advice given to you by your health care provider. Make sure you discuss any questions you have with your health care provider. Document Revised: 07/27/2020 Document Reviewed: 07/27/2020 Elsevier Patient Education  2023 [employer]  

## 2021-09-20 NOTE — Assessment & Plan Note (Addendum)
Insomnia not well controlled on trazodone 50 mg tablet by mouth at hour of sleep.  Patient is currently taking trazodone 300 mg tablet by mouth at bedtime without 4 hours of sleep per night.  I advised patient not to increase medication dose without contacting provider due to side effects that may be associated with high doses of medication.  Patient aware that if he does well on Ambien he will need a drug screen for controlled substance.  Started patient on Ambien if patient does well on current medication 6.25 mg tablet by mouth daily.  Follow-up with unresolved symptoms.

## 2021-09-21 LAB — CBC WITH DIFFERENTIAL/PLATELET
Basophils Absolute: 0.1 10*3/uL (ref 0.0–0.2)
Basos: 1 %
EOS (ABSOLUTE): 0.5 10*3/uL — ABNORMAL HIGH (ref 0.0–0.4)
Eos: 5 %
Hematocrit: 48.9 % (ref 37.5–51.0)
Hemoglobin: 16.3 g/dL (ref 13.0–17.7)
Immature Grans (Abs): 0.1 10*3/uL (ref 0.0–0.1)
Immature Granulocytes: 1 %
Lymphocytes Absolute: 3.2 10*3/uL — ABNORMAL HIGH (ref 0.7–3.1)
Lymphs: 38 %
MCH: 28.5 pg (ref 26.6–33.0)
MCHC: 33.3 g/dL (ref 31.5–35.7)
MCV: 86 fL (ref 79–97)
Monocytes Absolute: 0.6 10*3/uL (ref 0.1–0.9)
Monocytes: 7 %
Neutrophils Absolute: 4 10*3/uL (ref 1.4–7.0)
Neutrophils: 48 %
Platelets: 336 10*3/uL (ref 150–450)
RBC: 5.72 x10E6/uL (ref 4.14–5.80)
RDW: 14.2 % (ref 11.6–15.4)
WBC: 8.4 10*3/uL (ref 3.4–10.8)

## 2021-09-21 LAB — CMP14+EGFR
ALT: 41 IU/L (ref 0–44)
AST: 20 IU/L (ref 0–40)
Albumin/Globulin Ratio: 2.2 (ref 1.2–2.2)
Albumin: 4.6 g/dL (ref 3.8–4.9)
Alkaline Phosphatase: 87 IU/L (ref 44–121)
BUN/Creatinine Ratio: 12 (ref 9–20)
BUN: 10 mg/dL (ref 6–24)
Bilirubin Total: 0.5 mg/dL (ref 0.0–1.2)
CO2: 24 mmol/L (ref 20–29)
Calcium: 9.9 mg/dL (ref 8.7–10.2)
Chloride: 103 mmol/L (ref 96–106)
Creatinine, Ser: 0.84 mg/dL (ref 0.76–1.27)
Globulin, Total: 2.1 g/dL (ref 1.5–4.5)
Glucose: 106 mg/dL — ABNORMAL HIGH (ref 70–99)
Potassium: 4.5 mmol/L (ref 3.5–5.2)
Sodium: 142 mmol/L (ref 134–144)
Total Protein: 6.7 g/dL (ref 6.0–8.5)
eGFR: 103 mL/min/{1.73_m2} (ref >59)

## 2021-09-21 LAB — LIPID PANEL
Chol/HDL Ratio: 4.5 ratio (ref 0.0–5.0)
Cholesterol, Total: 200 mg/dL — ABNORMAL HIGH (ref 100–199)
HDL: 44 mg/dL (ref >39)
LDL Chol Calc (NIH): 115 mg/dL — ABNORMAL HIGH (ref 0–99)
Triglycerides: 236 mg/dL — ABNORMAL HIGH (ref 0–149)
VLDL Cholesterol Cal: 41 mg/dL — ABNORMAL HIGH (ref 5–40)

## 2021-09-21 LAB — PSA, TOTAL AND FREE
PSA, Free Pct: 17.9 %
PSA, Free: 0.25 ng/mL
Prostate Specific Ag, Serum: 1.4 ng/mL (ref 0.0–4.0)

## 2021-09-21 LAB — TSH: TSH: 3.94 u[IU]/mL (ref 0.450–4.500)

## 2021-09-22 ENCOUNTER — Other Ambulatory Visit: Payer: Self-pay | Admitting: Nurse Practitioner

## 2021-09-22 DIAGNOSIS — R7309 Other abnormal glucose: Secondary | ICD-10-CM

## 2021-09-23 NOTE — Progress Notes (Signed)
Patient returning call. Please call back around 3pm

## 2021-10-25 ENCOUNTER — Other Ambulatory Visit: Payer: Self-pay | Admitting: Nurse Practitioner

## 2021-10-25 DIAGNOSIS — G4709 Other insomnia: Secondary | ICD-10-CM

## 2021-11-04 ENCOUNTER — Encounter: Payer: Self-pay | Admitting: Family Medicine

## 2021-11-04 ENCOUNTER — Ambulatory Visit: Payer: BC Managed Care – PPO | Admitting: Family Medicine

## 2021-11-04 ENCOUNTER — Encounter: Payer: BC Managed Care – PPO | Admitting: Nurse Practitioner

## 2021-11-04 VITALS — BP 130/81 | HR 97 | Temp 98.3°F | Ht 72.0 in | Wt 247.4 lb

## 2021-11-04 DIAGNOSIS — J069 Acute upper respiratory infection, unspecified: Secondary | ICD-10-CM | POA: Diagnosis not present

## 2021-11-04 DIAGNOSIS — J40 Bronchitis, not specified as acute or chronic: Secondary | ICD-10-CM

## 2021-11-04 MED ORDER — PREDNISONE 10 MG PO TABS: ORAL_TABLET | ORAL | 0 refills | Status: DC

## 2021-11-04 MED ORDER — LEVOFLOXACIN 500 MG PO TABS: 500.0000 mg | ORAL_TABLET | Freq: Every day | ORAL | 0 refills | Status: DC

## 2021-11-04 MED ORDER — BETAMETHASONE SOD PHOS & ACET 6 (3-3) MG/ML IJ SUSP
6.0000 mg | Freq: Once | INTRAMUSCULAR | Status: AC
  Administered 2021-11-04: 6 mg via INTRAMUSCULAR

## 2021-11-04 NOTE — Progress Notes (Signed)
Patient cancelled appointment.

## 2021-11-04 NOTE — Progress Notes (Signed)
Subjective:  Patient ID: Logan Mckee, male    DOB: [birth year]  Age: 56 y.o. MRN: [id number]  CC: Cough, Nasal Congestion, Shortness of Breath, and Insomnia   HPI Logan Mckee presents for cold for 2 weeks. Much worse for 4 days. Chest congestion. Nasal congestion. Terrible cough. PRoductive of clear mucous. Occasionally dark yellow. Chest tight, wheezing. Short of breath X2 last night.      11/04/2021    4:05 PM 09/20/2021    9:42 AM 02/17/2021    3:11 PM  Depression screen PHQ 2/9  Decreased Interest 0 0 0  Down, Depressed, Hopeless 0 0 0  PHQ - 2 Score 0 0 0    History Logan Mckee has a past medical history of Acute right flank pain, ED (erectile dysfunction), HSV-2 infection, Hyperlipidemia, Nephrolithiasis, and Right ureteral stone.   He has a past surgical history that includes Cystoscopy w/ ureteral stent placement (Right, 05/31/2016); Extracorporeal shock wave lithotripsy (Left, 02/01/2010); Cystoscopy/ureteroscopy/holmium laser/stent placement (Right, 06/08/2016); and Holmium laser application (06/08/2016).   His family history includes Cancer (age of onset: 91) in his father.He reports that he has never smoked. He has never used smokeless tobacco. He reports current alcohol use. He reports that he does not use drugs.    ROS Review of Systems  Constitutional:  Negative for activity change, appetite change, chills and fever.  HENT:  Positive for congestion, postnasal drip, rhinorrhea and sinus pressure. Negative for ear discharge, ear pain, hearing loss, nosebleeds, sneezing and trouble swallowing.   Respiratory:  Positive for cough. Negative for chest tightness and shortness of breath.   Cardiovascular:  Negative for chest pain and palpitations.  Skin:  Negative for rash.    Objective:  BP 130/81   Pulse 97   Temp 98.3 F (36.8 C)   Ht 6' (1.829 m)   Wt 247 lb 6.4 oz (112.2 kg)   SpO2 96%   BMI 33.55 kg/m   BP Readings from Last 3 Encounters:  11/04/21 130/81  09/20/21  (!) 145/91  08/25/21 (!) 148/90    Wt Readings from Last 3 Encounters:  11/04/21 247 lb 6.4 oz (112.2 kg)  09/20/21 248 lb (112.5 kg)  08/25/21 246 lb (111.6 kg)     Physical Exam Constitutional:      Appearance: He is well-developed.  HENT:     Head: Normocephalic and atraumatic.     Right Ear: Tympanic membrane and external ear normal. No decreased hearing noted.     Left Ear: Tympanic membrane and external ear normal. No decreased hearing noted.     Nose: Mucosal edema present.     Right Sinus: No frontal sinus tenderness.     Left Sinus: No frontal sinus tenderness.     Mouth/Throat:     Pharynx: No oropharyngeal exudate or posterior oropharyngeal erythema.  Neck:     Meningeal: Brudzinski's sign absent.  Pulmonary:     Effort: No respiratory distress.     Breath sounds: Examination of the right-middle field reveals wheezing and rhonchi. Examination of the left-middle field reveals wheezing and rhonchi. Examination of the right-lower field reveals wheezing and rhonchi. Examination of the left-lower field reveals wheezing and rhonchi. Wheezing and rhonchi present.  Lymphadenopathy:     Head:     Right side of head: No preauricular adenopathy.     Left side of head: No preauricular adenopathy.     Cervical:     Right cervical: No superficial cervical adenopathy.    Left cervical: No superficial  cervical adenopathy.       Assessment & Plan:   Logan Mckee was seen today for cough, nasal congestion, shortness of breath and insomnia.  Diagnoses and all orders for this visit:  Upper respiratory infection with cough and congestion -     COVID-19, Flu A+B and RSV -     betamethasone acetate-betamethasone sodium phosphate (CELESTONE) injection 6 mg  Other orders -     levofloxacin (LEVAQUIN) 500 MG tablet; Take 1 tablet (500 mg total) by mouth daily. For 10 days -     predniSONE (DELTASONE) 10 MG tablet; Take 5 daily for 3 days followed by 4,3,2 and 1 for 3 days  each.       I am having Logan Mckee start on levofloxacin and predniSONE. I am also having him maintain his Omega-3 Fatty Acids (FISH OIL PO), Multiple Vitamins-Minerals (ICAPS PO), Cinnamon, Magnesium, vitamin E, Horse Chestnut (VENASTAT PO), ascorbic acid, Flaxseed Oil, Vitamin K2, multivitamin, COCONUT OIL PO, (Capsicum, Cayenne, (CAYENNE PO)), Lysine, sildenafil, fluticasone, diclofenac, Pseudoephedrine-Guaifenesin, traZODone, sildenafil, and zolpidem. We administered betamethasone acetate-betamethasone sodium phosphate.  Allergies as of 11/04/2021       Reactions   Demerol [meperidine] Rash        Medication List        Accurate as of November 04, 2021  4:54 PM. If you have any questions, ask your nurse or doctor.          ascorbic acid 500 MG tablet Commonly known as: VITAMIN C Take 500 mg by mouth daily.   CAYENNE PO Take 1 capsule by mouth daily.   Cinnamon 500 MG Tabs Take 1 tablet by mouth daily.   COCONUT OIL PO Take 1 capsule by mouth daily.   diclofenac 75 MG EC tablet Commonly known as: VOLTAREN Take 1 tablet (75 mg total) by mouth 2 (two) times daily.   FISH OIL PO Take 1,200 mg by mouth daily.   Flaxseed Oil 1200 MG Caps Take 1 capsule by mouth daily.   fluticasone 50 MCG/ACT nasal spray Commonly known as: FLONASE 2 sprays by Each Nare route daily.   ICAPS PO Take 1 capsule by mouth daily.   levofloxacin 500 MG tablet Commonly known as: LEVAQUIN Take 1 tablet (500 mg total) by mouth daily. For 10 days Started by: Mechele Claude, MD   Lysine 1000 MG Tabs Take by mouth.   Magnesium 250 MG Tabs Take 1 tablet by mouth daily.   multivitamin tablet Take 1 tablet by mouth daily.   predniSONE 10 MG tablet Commonly known as: DELTASONE Take 5 daily for 3 days followed by 4,3,2 and 1 for 3 days each. Started by: Mechele Claude, MD   Pseudoephedrine-Guaifenesin [id number] [id number] MG Tb12 Take 1 tablet by mouth 2 (two) times daily. For congestion    sildenafil 100 MG tablet Commonly known as: Viagra TAKE 1 TABLET EVERY DAY AS NEEDED FOR ERECTILE DYSFUNCTION   sildenafil 20 MG tablet Commonly known as: REVATIO Take 2-5 tablets as needed for sexual activity   traZODone 50 MG tablet Commonly known as: DESYREL TAKE 0.5-1 TABLETS BY MOUTH AT BEDTIME AS NEEDED FOR SLEEP.   VENASTAT PO Take 1 capsule by mouth daily.   vitamin E 180 MG (400 UNITS) capsule Take 400 Units by mouth daily.   Vitamin K2 100 MCG Caps Take 1 capsule by mouth daily.   zolpidem 6.25 MG CR tablet Commonly known as: AMBIEN CR Take 1 tablet (6.25 mg total) by mouth at bedtime as needed for sleep.  Follow-up: No follow-ups on file.  Claretta Fraise, M.D.

## 2021-11-05 LAB — COVID-19, FLU A+B AND RSV
Influenza A, NAA: NOT DETECTED
Influenza B, NAA: NOT DETECTED
RSV, NAA: NOT DETECTED
SARS-CoV-2, NAA: NOT DETECTED

## 2021-12-19 ENCOUNTER — Other Ambulatory Visit: Payer: Self-pay | Admitting: Nurse Practitioner

## 2021-12-19 DIAGNOSIS — G4709 Other insomnia: Secondary | ICD-10-CM

## 2021-12-21 NOTE — Telephone Encounter (Signed)
Last office visit 09/20/21 Last refill 09/16/21, #90, no refills

## 2022-08-19 ENCOUNTER — Encounter: Payer: Self-pay | Admitting: Family Medicine

## 2022-08-19 ENCOUNTER — Other Ambulatory Visit: Payer: Self-pay | Admitting: Family Medicine

## 2022-08-19 DIAGNOSIS — G4709 Other insomnia: Secondary | ICD-10-CM

## 2022-08-19 NOTE — Telephone Encounter (Signed)
Letter sent.

## 2022-08-19 NOTE — Telephone Encounter (Signed)
Stacks NTBS last OV in Aug NO RFs sent

## 2022-11-25 ENCOUNTER — Ambulatory Visit (INDEPENDENT_AMBULATORY_CARE_PROVIDER_SITE_OTHER): Payer: BC Managed Care – PPO | Admitting: Family Medicine

## 2022-11-25 ENCOUNTER — Encounter: Payer: Self-pay | Admitting: Family Medicine

## 2022-11-25 VITALS — BP 149/83 | HR 79 | Temp 97.7°F | Ht 73.0 in | Wt 246.2 lb

## 2022-11-25 DIAGNOSIS — G4726 Circadian rhythm sleep disorder, shift work type: Secondary | ICD-10-CM | POA: Diagnosis not present

## 2022-11-25 DIAGNOSIS — Z1331 Encounter for screening for depression: Secondary | ICD-10-CM

## 2022-11-25 DIAGNOSIS — G4709 Other insomnia: Secondary | ICD-10-CM | POA: Diagnosis not present

## 2022-11-25 MED ORDER — DOXEPIN HCL 6 MG PO TABS: 6.0000 mg | ORAL_TABLET | Freq: Every day | ORAL | 0 refills | Status: DC

## 2022-11-25 NOTE — Progress Notes (Signed)
Subjective:  Patient ID: Logan Mckee, male    DOB: [birth year], 57 y.o.   MRN: [id number]  Patient Care Team: Mechele Claude, MD as PCP - General (Family Medicine)   Chief Complaint:  trouble sleeping (In the last two days patient states he has not slept 3 hours 19 mins.  States his mind will not stop and do to pain from job being physical demanding. )  HPI: Logan Mckee is a 57 y.o. male presenting on 11/25/2022 for trouble sleeping (In the last two days patient states he has not slept 3 hours 19 mins.  States his mind will not stop and do to pain from job being physical demanding. ) States that it started years ago. He has tried multiple OTC and online options to help him sleep. States that his job is demanding and they are making him make up hours for days missed.  Reports fatigue and soreness due to work and lack of sleep.  States that his symptoms are worsening.  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 States that he will try to "start the process" of sleeping at that time.  States that this is no sounds, no light, does not use his phone, listens to fan. States that he "fights" off thoughts of stress. Used to try reading, but that caused eye strain.  States that he eats and drinks at that time. Limits his caffeine to 2 cans of soda and one energy drink.  Reports that he wakes up a lot during his sleep.  Is trying melatonin. Has increased his dose over time to 500 mg.  Reports that he previously tried trazodone and ambien.  States that he started increasing it 2 months ago and was taking it by the handfuls.  Reports that stress, pain, hard day at work will make it worse.  States that he did increase trazodone to "quadruple"  States that it never worked for him.  Reports that he mixed "orange pill" with Unisom.  In the past two weeks he has tried vick's z chewables, spray of nature's own melatonin, unisom  Relevant past medical, surgical, family, and social history reviewed and  updated as indicated.  Allergies and medications reviewed and updated. Data reviewed: Chart in Epic.  Past Medical History:  Diagnosis Date   Acute right flank pain    ED (erectile dysfunction)    HSV-2 infection    Hyperlipidemia    Nephrolithiasis    bilateral non-obstructive right x2 , left x1  per dr Berneice Heinrich note (urologist) 05-31-2016   Right ureteral stone     Past Surgical History:  Procedure Laterality Date   CYSTOSCOPY W/ URETERAL STENT PLACEMENT Right 05/31/2016   Procedure: CYSTOSCOPY WITH RETROGRADE PYELOGRAM/ RIGHT URETERAL STENT PLACEMENT;  Surgeon: Sebastian Ache, MD;  Location: [facility];  Service: Urology;  Laterality: Right;   CYSTOSCOPY/URETEROSCOPY/HOLMIUM LASER/STENT PLACEMENT Right 06/08/2016   Procedure: CYSTOSCOPY RIGHT RETROGRADE WITH RIGHT URETEROSCOPY RIGHT STENT EXCHANGE;  Surgeon: Sebastian Ache, MD;  Location: [facility];  Service: Urology;  Laterality: Right;   EXTRACORPOREAL SHOCK WAVE LITHOTRIPSY Left 02/01/2010   HOLMIUM LASER APPLICATION  06/08/2016   Procedure: HOLMIUM LASER APPLICATION;  Surgeon: Sebastian Ache, MD;  Location: [facility];  Service: Urology;;   Social History   Socioeconomic History   Marital status: Single    Spouse name: Not on file   Number of children: Not on file   Years of education: Not on file   Highest education level:  Not on file  Occupational History   Not on file  Tobacco Use   Smoking status: Never   Smokeless tobacco: Never  Substance and Sexual Activity   Alcohol use: Yes    Comment: OCCASSIONAL   Drug use: No   Sexual activity: Not on file  Other Topics Concern   Not on file  Social History Narrative   Not on file   Social Determinants of Health   Financial Resource Strain: Not on file  Food Insecurity: Not on file  Transportation Needs: Not on file  Physical Activity: Not on file  Stress: Not on file  Social Connections: Not on file  Intimate Partner Violence: Not on  file   Outpatient Encounter Medications as of 11/25/2022  Medication Sig   Capsicum, Cayenne, (CAYENNE PO) Take 1 capsule by mouth daily.   Cinnamon 500 MG TABS Take 1 tablet by mouth daily.    COCONUT OIL PO Take 1 capsule by mouth daily.    diclofenac (VOLTAREN) 75 MG EC tablet Take 1 tablet (75 mg total) by mouth 2 (two) times daily.   Flaxseed, Linseed, (FLAXSEED OIL) 1200 MG CAPS Take 1 capsule by mouth daily.    fluticasone (FLONASE) 50 MCG/ACT nasal spray 2 sprays by Each Nare route daily.   Horse Chestnut (VENASTAT PO) Take 1 capsule by mouth daily.    Lysine 1000 MG TABS Take by mouth.   Magnesium 250 MG TABS Take 1 tablet by mouth daily.    Menaquinone-7 (VITAMIN K2) 100 MCG CAPS Take 1 capsule by mouth daily.    Multiple Vitamin (MULTIVITAMIN) tablet Take 1 tablet by mouth daily.   Multiple Vitamins-Minerals (ICAPS PO) Take 1 capsule by mouth daily.    Omega-3 Fatty Acids (FISH OIL PO) Take 1,200 mg by mouth daily.   sildenafil (REVATIO) 20 MG tablet Take 2-5 tablets as needed for sexual activity   sildenafil (VIAGRA) 100 MG tablet TAKE 1 TABLET EVERY DAY AS NEEDED FOR ERECTILE DYSFUNCTION   vitamin C (ASCORBIC ACID) 500 MG tablet Take 500 mg by mouth daily.   vitamin E 180 MG (400 UNITS) capsule Take 400 Units by mouth daily.   [DISCONTINUED] levofloxacin (LEVAQUIN) 500 MG tablet Take 1 tablet (500 mg total) by mouth daily. For 10 days   [DISCONTINUED] predniSONE (DELTASONE) 10 MG tablet Take 5 daily for 3 days followed by 4,3,2 and 1 for 3 days each.   [DISCONTINUED] Pseudoephedrine-Guaifenesin [phone] MG TB12 Take 1 tablet by mouth 2 (two) times daily. For congestion   [DISCONTINUED] traZODone (DESYREL) 50 MG tablet TAKE 1/2 TO 1 TABLET BY MOUTH AT BEDTIME AS NEEDED FOR SLEEP   [DISCONTINUED] zolpidem (AMBIEN CR) 6.25 MG CR tablet Take 1 tablet (6.25 mg total) by mouth at bedtime as needed for sleep.   No facility-administered encounter medications on file as of 11/25/2022.    Allergies  Allergen Reactions   Demerol [Meperidine] Rash   Review of Systems As per HPI  Objective:  BP (!) 149/83   Pulse 79   Temp 97.7 F (36.5 C) (Temporal)   Ht 6\' 1"  (1.854 m)   Wt 246 lb 3.2 oz (111.7 kg)   SpO2 96%   BMI 32.48 kg/m    Wt Readings from Last 3 Encounters:  11/04/21 247 lb 6.4 oz (112.2 kg)  09/20/21 248 lb (112.5 kg)  08/25/21 246 lb (111.6 kg)   Physical Exam Constitutional:      General: He is awake. He is not in acute distress.  Appearance: Normal appearance. He is well-developed and well-groomed. He is obese. He is not ill-appearing, toxic-appearing or diaphoretic.  Cardiovascular:     Rate and Rhythm: Normal rate.     Pulses: Normal pulses.          Radial pulses are 2+ on the right side and 2+ on the left side.       Posterior tibial pulses are 2+ on the right side and 2+ on the left side.     Heart sounds: Normal heart sounds. No murmur heard.    No gallop.  Pulmonary:     Effort: Pulmonary effort is normal. No respiratory distress.     Breath sounds: Normal breath sounds. No stridor. No wheezing, rhonchi or rales.  Musculoskeletal:     Cervical back: Full passive range of motion without pain and neck supple.     Right lower leg: No edema.     Left lower leg: No edema.  Skin:    General: Skin is warm.     Capillary Refill: Capillary refill takes less than 2 seconds.  Neurological:     General: No focal deficit present.     Mental Status: He is alert, oriented to person, place, and time and easily aroused. Mental status is at baseline.     GCS: GCS eye subscore is 4. GCS verbal subscore is 5. GCS motor subscore is 6.     Motor: No weakness.  Psychiatric:        Attention and Perception: Attention and perception normal.        Mood and Affect: Affect normal. Mood is anxious.        Speech: Speech is rapid and pressured and tangential.        Behavior: Behavior normal. Behavior is cooperative.        Thought Content: Thought  content normal. Thought content does not include homicidal or suicidal ideation. Thought content does not include homicidal or suicidal plan.        Cognition and Memory: Cognition and memory normal.        Judgment: Judgment normal.    Results for orders placed or performed in visit on 11/04/21  COVID-19, Flu A+B and RSV   Specimen: Nasopharyngeal(NP) swabs in vial transport medium  Result Value Ref Range   SARS-CoV-2, NAA Not Detected Not Detected   Influenza A, NAA Not Detected Not Detected   Influenza B, NAA Not Detected Not Detected   RSV, NAA Not Detected Not Detected   Test Information: Comment        11/25/2022    2:24 PM 11/04/2021    4:05 PM 09/20/2021    9:42 AM 02/17/2021    3:11 PM 04/13/2018    8:25 AM  Depression screen PHQ 2/9  Decreased Interest 0 0 0 0 0  Down, Depressed, Hopeless 0 0 0 0 0  PHQ - 2 Score 0 0 0 0 0  Altered sleeping 3      Tired, decreased energy 3      Change in appetite 0      Feeling bad or failure about yourself  0      Trouble concentrating 0      Moving slowly or fidgety/restless 1      Suicidal thoughts 0      PHQ-9 Score 7      Difficult doing work/chores Very difficult          11/25/2022    2:24 PM  GAD 7 : Generalized  Anxiety Score  Nervous, Anxious, on Edge 0  Control/stop worrying 1  Worry too much - different things 1  Trouble relaxing 3  Restless 0  Easily annoyed or irritable 1  Afraid - awful might happen 2  Total GAD 7 Score 8  Anxiety Difficulty Very difficult   Pertinent labs & imaging results that were available during my care of the patient were reviewed by me and considered in my medical decision making.  Assessment & Plan:  Emilliano was seen today for trouble sleeping.  Diagnoses and all orders for this visit:  1. Shift work sleep disorder Discussed with patient at length that he is taking well over the recommended amount of melatonin. Discussed at length with patient to not mix medications.  Will start  medication as below. Educated patient that this is the max dose and he is not to self medicate.  Referral placed as below due to elevated stop bang score.  - Doxepin HCl 6 MG TABS; Take 1 tablet (6 mg total) by mouth daily.  Dispense: 30 tablet; Refill: 0 - Ambulatory referral to Sleep Studies  2. Other insomnia As above.  - Doxepin HCl 6 MG TABS; Take 1 tablet (6 mg total) by mouth daily.  Dispense: 30 tablet; Refill: 0 - Ambulatory referral to Sleep Studies  3. Encounter for screening for depression Elevated screenings. Patient is interested in starting pharmacologic medications. Discussed with patient that we will start with the above. If symptoms do not improve can add in or change regimen. Weary to start additional medications due to patient self-medicating and side effect profile of antidepressants.   Continue all other maintenance medications.  Follow up plan: Return in about 4 weeks (around 12/23/2022).  Time spent on visit, approx 61 minutes   Continue healthy lifestyle choices, including diet (rich in fruits, vegetables, and lean proteins, and low in salt and simple carbohydrates) and exercise (at least 30 minutes of moderate physical activity daily).  Written and verbal instructions provided   The above assessment and management plan was discussed with the patient. The patient verbalized understanding of and has agreed to the management plan. Patient is aware to call the clinic if they develop any new symptoms or if symptoms persist or worsen. Patient is aware when to return to the clinic for a follow-up visit. Patient educated on when it is appropriate to go to the emergency department.   Neale Burly, DNP-FNP [facility] [address] [place], [place] [address] [phone]

## 2022-11-29 ENCOUNTER — Telehealth: Payer: Self-pay

## 2022-11-29 NOTE — Telephone Encounter (Signed)
[place] (Key: [id number]) PA Case ID #: [id number] Rx #: [id number] Need Help? Call us at [phone] Status sent iconSent to Plan today Drug Doxepin HCl 6MG  tablets ePA cloud logo Lobbyist PA Form (2017 NCPDP) Original Claim Info [phone] PLAN PREF ESZOPICLONE,ZOLPIDEM,ZALEPLON,ZOLPIDEM ER, ESTAZOLAM, FLURAZEPAM,RAMELTEON, TEMAZEPAM, TRIAZOLAMDRUG REQUIRES PRIOR AUTHORIZATION

## 2022-11-30 ENCOUNTER — Telehealth: Payer: Self-pay | Admitting: Family Medicine

## 2022-11-30 DIAGNOSIS — Z0279 Encounter for issue of other medical certificate: Secondary | ICD-10-CM

## 2022-11-30 NOTE — Telephone Encounter (Signed)
Pharmacy Patient Advocate Encounter  Received notification from [facility] that Prior Authorization for Doxepin HCl 6MG  tablets has been APPROVED from 11/29/22 to 11/29/23   PA #/Case ID/Reference #: [id number]

## 2022-12-01 NOTE — Telephone Encounter (Signed)
Information completed and forwarded to treating provider

## 2022-12-02 NOTE — Telephone Encounter (Signed)
PCP completed and signed FMLA forms. They have been faxed to [employer] at fax number [phone]. Patient has been contacted and informed they are complete, copy at front.

## 2023-01-07 ENCOUNTER — Other Ambulatory Visit: Payer: Self-pay | Admitting: Family Medicine

## 2023-01-07 DIAGNOSIS — G4709 Other insomnia: Secondary | ICD-10-CM

## 2023-01-07 DIAGNOSIS — G4726 Circadian rhythm sleep disorder, shift work type: Secondary | ICD-10-CM

## 2023-05-12 ENCOUNTER — Ambulatory Visit: Payer: BC Managed Care – PPO | Admitting: Family Medicine

## 2023-05-12 ENCOUNTER — Encounter: Payer: Self-pay | Admitting: Family Medicine

## 2023-05-12 VITALS — BP 148/81 | HR 82 | Temp 97.8°F | Ht 73.0 in | Wt 250.0 lb

## 2023-05-12 DIAGNOSIS — G4726 Circadian rhythm sleep disorder, shift work type: Secondary | ICD-10-CM | POA: Diagnosis not present

## 2023-05-12 DIAGNOSIS — G4709 Other insomnia: Secondary | ICD-10-CM | POA: Diagnosis not present

## 2023-05-12 MED ORDER — QUETIAPINE FUMARATE 25 MG PO TABS
25.0000 mg | ORAL_TABLET | Freq: Every day | ORAL | 1 refills | Status: DC
Start: 2023-05-12 — End: 2023-06-05

## 2023-05-12 NOTE — Progress Notes (Signed)
 Subjective:  Patient ID: Logan Mckee, male    DOB: 1965-08-07, 58 y.o.   MRN: [id number]  Patient Care Team: Mechele Claude, MD as PCP - General (Family Medicine)   Chief Complaint:  Insomnia  HPI: Logan Mckee is a 58 y.o. male presenting on 05/12/2023 for Insomnia This is a chronic conditions that started several years ago. He has tried multiple medications in the past such as ambien, trazodone, zolpidem, and doxepin. He has also tried multiple OTC and online options to help. Has tried unisom. Reports that an "orange pill" helped him in the past, but he does not know the name. 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  States that before trying to sleep he cuts off TV, phone, no light. He is limiting caffeine intake 1-2 cans of soda per day. He is taking 75 mg of melatonin daily. States that his mind will not such down when he is trying to sleep. He was previously referred to sleep medicine. Reports that they wanted another test and he was not able to complete it due to scheduling.   Relevant past medical, surgical, family, and social history reviewed and updated as indicated.  Allergies and medications reviewed and updated. Data reviewed: Chart in Epic.   Past Medical History:  Diagnosis Date   Acute right flank pain    ED (erectile dysfunction)    HSV-2 infection    Hyperlipidemia    Nephrolithiasis    bilateral non-obstructive right x2 , left x1  per dr Berneice Heinrich note (urologist) 05-31-2016   Right ureteral stone     Past Surgical History:  Procedure Laterality Date   CYSTOSCOPY W/ URETERAL STENT PLACEMENT Right 05/31/2016   Procedure: CYSTOSCOPY WITH RETROGRADE PYELOGRAM/ RIGHT URETERAL STENT PLACEMENT;  Surgeon: Sebastian Ache, MD;  Location: [facility];  Service: Urology;  Laterality: Right;   CYSTOSCOPY/URETEROSCOPY/HOLMIUM LASER/STENT PLACEMENT Right 06/08/2016   Procedure: CYSTOSCOPY RIGHT RETROGRADE WITH RIGHT URETEROSCOPY RIGHT STENT EXCHANGE;  Surgeon: Sebastian Ache, MD;  Location: [facility];  Service: Urology;  Laterality: Right;   EXTRACORPOREAL SHOCK WAVE LITHOTRIPSY Left 02/01/2010   HOLMIUM LASER APPLICATION  06/08/2016   Procedure: HOLMIUM LASER APPLICATION;  Surgeon: Sebastian Ache, MD;  Location: [facility];  Service: Urology;;    Social History   Socioeconomic History   Marital status: Single    Spouse name: Not on file   Number of children: Not on file   Years of education: Not on file   Highest education level: Not on file  Occupational History   Not on file  Tobacco Use   Smoking status: Never   Smokeless tobacco: Never  Substance and Sexual Activity   Alcohol use: Yes    Comment: OCCASSIONAL   Drug use: No   Sexual activity: Not on file  Other Topics Concern   Not on file  Social History Narrative   Not on file   Social Drivers of Health   Financial Resource Strain: Not on file  Food Insecurity: Not on file  Transportation Needs: Not on file  Physical Activity: Not on file  Stress: Not on file  Social Connections: Not on file  Intimate Partner Violence: Not on file    Outpatient Encounter Medications as of 05/12/2023  Medication Sig   Capsicum, Cayenne, (CAYENNE PO) Take 1 capsule by mouth daily.   Cinnamon 500 MG TABS Take 1 tablet by mouth daily.    COCONUT OIL PO Take 1 capsule by  mouth daily.    diclofenac (VOLTAREN) 75 MG EC tablet Take 1 tablet (75 mg total) by mouth 2 (two) times daily.   Doxepin HCl 6 MG TABS TAKE 1 TABLET BY MOUTH DAILY.   Flaxseed, Linseed, (FLAXSEED OIL) 1200 MG CAPS Take 1 capsule by mouth daily.    fluticasone (FLONASE) 50 MCG/ACT nasal spray 2 sprays by Each Nare route daily.   Horse Chestnut (VENASTAT PO) Take 1 capsule by mouth daily.    Lysine 1000 MG TABS Take by mouth.   Magnesium 250 MG TABS Take 1 tablet by mouth daily.    Menaquinone-7 (VITAMIN K2) 100 MCG CAPS Take 1 capsule by mouth daily.    Multiple Vitamin (MULTIVITAMIN) tablet Take  1 tablet by mouth daily.   Multiple Vitamins-Minerals (ICAPS PO) Take 1 capsule by mouth daily.    Omega-3 Fatty Acids (FISH OIL PO) Take 1,200 mg by mouth daily.   sildenafil (REVATIO) 20 MG tablet Take 2-5 tablets as needed for sexual activity   sildenafil (VIAGRA) 100 MG tablet TAKE 1 TABLET EVERY DAY AS NEEDED FOR ERECTILE DYSFUNCTION   vitamin C (ASCORBIC ACID) 500 MG tablet Take 500 mg by mouth daily.   vitamin E 180 MG (400 UNITS) capsule Take 400 Units by mouth daily.   No facility-administered encounter medications on file as of 05/12/2023.    Allergies  Allergen Reactions   Demerol [Meperidine] Rash    Review of Systems As per HPI  Objective:  BP (!) 148/81   Pulse 82   Temp 97.8 F (36.6 C)   Ht 6\' 1"  (1.854 m)   Wt 250 lb (113.4 kg)   SpO2 98%   BMI 32.98 kg/m    Wt Readings from Last 3 Encounters:  05/12/23 250 lb (113.4 kg)  11/25/22 246 lb 3.2 oz (111.7 kg)  11/04/21 247 lb 6.4 oz (112.2 kg)    Physical Exam Constitutional:      General: He is awake. He is not in acute distress.    Appearance: Normal appearance. He is well-developed and well-groomed. He is obese. He is not ill-appearing, toxic-appearing or diaphoretic.  Cardiovascular:     Rate and Rhythm: Normal rate and regular rhythm.     Pulses: Normal pulses.          Radial pulses are 2+ on the right side and 2+ on the left side.       Posterior tibial pulses are 2+ on the right side and 2+ on the left side.     Heart sounds: Normal heart sounds. No murmur heard.    No gallop.  Pulmonary:     Effort: Pulmonary effort is normal. No respiratory distress.     Breath sounds: Normal breath sounds. No stridor. No wheezing, rhonchi or rales.  Musculoskeletal:     Cervical back: Full passive range of motion without pain and neck supple.     Right lower leg: No edema.     Left lower leg: No edema.  Skin:    General: Skin is warm.     Capillary Refill: Capillary refill takes less than 2 seconds.   Neurological:     General: No focal deficit present.     Mental Status: He is alert, oriented to person, place, and time and easily aroused. Mental status is at baseline.     GCS: GCS eye subscore is 4. GCS verbal subscore is 5. GCS motor subscore is 6.     Motor: No weakness.  Psychiatric:  Attention and Perception: Attention and perception normal.        Mood and Affect: Mood and affect normal.        Speech: Speech normal.        Behavior: Behavior normal. Behavior is cooperative.        Thought Content: Thought content normal. Thought content does not include homicidal or suicidal ideation. Thought content does not include homicidal or suicidal plan.        Cognition and Memory: Cognition and memory normal.        Judgment: Judgment normal.     Results for orders placed or performed in visit on 11/04/21  COVID-19, Flu A+B and RSV   Collection Time: 11/04/21  4:42 PM   Specimen: Nasopharyngeal(NP) swabs in vial transport medium  Result Value Ref Range   SARS-CoV-2, NAA Not Detected Not Detected   Influenza A, NAA Not Detected Not Detected   Influenza B, NAA Not Detected Not Detected   RSV, NAA Not Detected Not Detected   Test Information: Comment        05/12/2023   12:01 PM 11/25/2022    2:24 PM 11/04/2021    4:05 PM 09/20/2021    9:42 AM 02/17/2021    3:11 PM  Depression screen PHQ 2/9  Decreased Interest 0 0 0 0 0  Down, Depressed, Hopeless 0 0 0 0 0  PHQ - 2 Score 0 0 0 0 0  Altered sleeping 3 3     Tired, decreased energy 1 3     Change in appetite 0 0     Feeling bad or failure about yourself  0 0     Trouble concentrating 0 0     Moving slowly or fidgety/restless  1     Suicidal thoughts  0     PHQ-9 Score 4 7     Difficult doing work/chores Very difficult Very difficult          05/12/2023   12:01 PM 11/25/2022    2:24 PM  GAD 7 : Generalized Anxiety Score  Nervous, Anxious, on Edge 0 0  Control/stop worrying 0 1  Worry too much - different things 0  1  Trouble relaxing 0 3  Restless 0 0  Easily annoyed or irritable 0 1  Afraid - awful might happen 0 2  Total GAD 7 Score 0 8  Anxiety Difficulty Not difficult at all Very difficult   Pertinent labs & imaging results that were available during my care of the patient were reviewed by me and considered in my medical decision making.  Assessment & Plan:  Jernard was seen today for insomnia.  Diagnoses and all orders for this visit:  Shift work sleep disorder Will trial medication as below. Instructed patient that he needs to reduce intake of melatonin as he is still taking a significantly high dose. Will refer to psychiatry as below as patient has failed multiple outpatient therapies. Will plan for EKG if patient continues on seroquel.  -     Ambulatory referral to Psychiatry -     QUEtiapine (SEROQUEL) 25 MG tablet; Take 1 tablet (25 mg total) by mouth at bedtime.  Other insomnia As above.  -     Ambulatory referral to Psychiatry -     QUEtiapine (SEROQUEL) 25 MG tablet; Take 1 tablet (25 mg total) by mouth at bedtime.  Continue all other maintenance medications.  Follow up plan: Return in about 2 weeks (around 05/26/2023) for estab care .  Continue healthy lifestyle choices, including diet (rich in fruits, vegetables, and lean proteins, and low in salt and simple carbohydrates) and exercise (at least 30 minutes of moderate physical activity daily).  Written and verbal instructions provided   The above assessment and management plan was discussed with the patient. The patient verbalized understanding of and has agreed to the management plan. Patient is aware to call the clinic if they develop any new symptoms or if symptoms persist or worsen. Patient is aware when to return to the clinic for a follow-up visit. Patient educated on when it is appropriate to go to the emergency department.   Neale Burly, DNP-FNP [facility] [address] [place],  [place] [address] [phone]

## 2023-05-17 ENCOUNTER — Ambulatory Visit: Payer: BC Managed Care – PPO | Admitting: Family Medicine

## 2023-05-17 ENCOUNTER — Encounter: Payer: Self-pay | Admitting: Family Medicine

## 2023-05-17 VITALS — BP 118/67 | HR 96 | Temp 98.7°F | Ht 73.0 in | Wt 251.0 lb

## 2023-05-17 DIAGNOSIS — G4709 Other insomnia: Secondary | ICD-10-CM | POA: Diagnosis not present

## 2023-05-17 DIAGNOSIS — Z6832 Body mass index (BMI) 32.0-32.9, adult: Secondary | ICD-10-CM

## 2023-05-17 DIAGNOSIS — R7309 Other abnormal glucose: Secondary | ICD-10-CM | POA: Diagnosis not present

## 2023-05-17 DIAGNOSIS — E6609 Other obesity due to excess calories: Secondary | ICD-10-CM | POA: Diagnosis not present

## 2023-05-17 DIAGNOSIS — A6000 Herpesviral infection of urogenital system, unspecified: Secondary | ICD-10-CM

## 2023-05-17 DIAGNOSIS — N529 Male erectile dysfunction, unspecified: Secondary | ICD-10-CM

## 2023-05-17 DIAGNOSIS — E66811 Obesity, class 1: Secondary | ICD-10-CM | POA: Insufficient documentation

## 2023-05-17 DIAGNOSIS — E782 Mixed hyperlipidemia: Secondary | ICD-10-CM

## 2023-05-17 LAB — BAYER DCA HB A1C WAIVED: HB A1C (BAYER DCA - WAIVED): 5.7 % — ABNORMAL HIGH (ref 4.8–5.6)

## 2023-05-17 MED ORDER — SILDENAFIL CITRATE 25 MG PO TABS
25.0000 mg | ORAL_TABLET | Freq: Every day | ORAL | 0 refills | Status: DC | PRN
Start: 2023-05-17 — End: 2024-01-29

## 2023-05-17 NOTE — Patient Instructions (Signed)
[phone] 

## 2023-05-17 NOTE — Progress Notes (Signed)
 New Patient Office Visit  Subjective   Patient ID: Logan Mckee, male    DOB: [birth year]  Age: 58 y.o. MRN: [id number]  CC:  Chief Complaint  Patient presents with   New Patient (Initial Visit)    Establish care   HPI Logan Mckee presents to establish care He was previously established with Je at [facility]. He has not reestablished with PCP. He has been seen by [facility] for acute visits. 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 Has history of elevated BP, but has never been diagnosed with HTN. He is making diet and lifestyle modifications. He is increasing water, decreased sodas (2 per day). Reduced sugar intake. Fast food 2-3 times per week. Mainly bakes and broils his food, eats a lot of fish. Eats chinese food (his favorite) 2 times per month.   ED  States that he has not taken sildenafil in several months. He states that he had success with it when he was taking it. States that he was evaluated by eye doctor today at [facility]. He does not wish to use viagra often due to concern for visual disturbances. He would like to have it on hand.   Insomnia  He is doing well with seroquel. He is not having any side effects. Reports that it takes about 3 hours to start working. He is having dreams for the first time in a while. He takes medication at 4 am when he gets home from work. He was able to sleep ~5-8 hours per day this week.   Outpatient Encounter Medications as of 05/17/2023  Medication Sig   Capsicum, Cayenne, (CAYENNE PO) Take 1 capsule by mouth daily.   Cinnamon 500 MG TABS Take 1 tablet by mouth daily.    COCONUT OIL PO Take 1 capsule by mouth daily.    diclofenac (VOLTAREN) 75 MG EC tablet Take 1 tablet (75 mg total) by mouth 2 (two) times daily.   Flaxseed, Linseed, (FLAXSEED OIL) 1200 MG CAPS Take 1 capsule by mouth daily.    fluticasone (FLONASE) 50 MCG/ACT nasal spray 2 sprays by Each Nare route daily.   Horse Chestnut (VENASTAT PO) Take  1 capsule by mouth daily.    Lysine 1000 MG TABS Take by mouth.   Magnesium 250 MG TABS Take 1 tablet by mouth daily.    Menaquinone-7 (VITAMIN K2) 100 MCG CAPS Take 1 capsule by mouth daily.    Multiple Vitamin (MULTIVITAMIN) tablet Take 1 tablet by mouth daily.   Multiple Vitamins-Minerals (ICAPS PO) Take 1 capsule by mouth daily.    Omega-3 Fatty Acids (FISH OIL PO) Take 1,200 mg by mouth daily.   QUEtiapine (SEROQUEL) 25 MG tablet Take 1 tablet (25 mg total) by mouth at bedtime.   sildenafil (REVATIO) 20 MG tablet Take 2-5 tablets as needed for sexual activity   sildenafil (VIAGRA) 100 MG tablet TAKE 1 TABLET EVERY DAY AS NEEDED FOR ERECTILE DYSFUNCTION   vitamin C (ASCORBIC ACID) 500 MG tablet Take 500 mg by mouth daily.   vitamin E 180 MG (400 UNITS) capsule Take 400 Units by mouth daily.   No facility-administered encounter medications on file as of 05/17/2023.    Past Medical History:  Diagnosis Date   Acute right flank pain    ED (erectile dysfunction)    HSV-2 infection    Hyperlipidemia    Nephrolithiasis    bilateral non-obstructive right x2 , left x1  per dr Berneice Heinrich  note (urologist) 05-31-2016   Right ureteral stone     Past Surgical History:  Procedure Laterality Date   CYSTOSCOPY W/ URETERAL STENT PLACEMENT Right 05/31/2016   Procedure: CYSTOSCOPY WITH RETROGRADE PYELOGRAM/ RIGHT URETERAL STENT PLACEMENT;  Surgeon: Sebastian Ache, MD;  Location: [facility];  Service: Urology;  Laterality: Right;   CYSTOSCOPY/URETEROSCOPY/HOLMIUM LASER/STENT PLACEMENT Right 06/08/2016   Procedure: CYSTOSCOPY RIGHT RETROGRADE WITH RIGHT URETEROSCOPY RIGHT STENT EXCHANGE;  Surgeon: Sebastian Ache, MD;  Location: [facility];  Service: Urology;  Laterality: Right;   EXTRACORPOREAL SHOCK WAVE LITHOTRIPSY Left 02/01/2010   HOLMIUM LASER APPLICATION  06/08/2016   Procedure: HOLMIUM LASER APPLICATION;  Surgeon: Sebastian Ache, MD;  Location: [facility];  Service:  Urology;;    Family History  Problem Relation Age of Onset   Cancer Father 76       Thyroid Cancer    Social History   Socioeconomic History   Marital status: Single    Spouse name: Not on file   Number of children: Not on file   Years of education: Not on file   Highest education level: Not on file  Occupational History   Not on file  Tobacco Use   Smoking status: Never   Smokeless tobacco: Never  Substance and Sexual Activity   Alcohol use: Yes    Comment: OCCASSIONAL   Drug use: No   Sexual activity: Not on file  Other Topics Concern   Not on file  Social History Narrative   Not on file   Social Drivers of Health   Financial Resource Strain: Not on file  Food Insecurity: Not on file  Transportation Needs: Not on file  Physical Activity: Not on file  Stress: Not on file  Social Connections: Not on file  Intimate Partner Violence: Not on file   ROS As per HPI   Objective   BP 118/67   Pulse 96   Temp 98.7 F (37.1 C)   Ht 6\' 1"  (1.854 m)   Wt 251 lb (113.9 kg)   SpO2 96%   BMI 33.12 kg/m   Physical Exam Constitutional:      General: He is awake. He is not in acute distress.    Appearance: Normal appearance. He is well-developed and well-groomed. He is obese. He is not ill-appearing, toxic-appearing or diaphoretic.  Cardiovascular:     Rate and Rhythm: Normal rate and regular rhythm.     Pulses: Normal pulses.          Radial pulses are 2+ on the right side and 2+ on the left side.       Posterior tibial pulses are 2+ on the right side and 2+ on the left side.     Heart sounds: Normal heart sounds. No murmur heard.    No gallop.  Pulmonary:     Effort: Pulmonary effort is normal. No respiratory distress.     Breath sounds: Normal breath sounds. No stridor. No wheezing, rhonchi or rales.  Musculoskeletal:     Cervical back: Full passive range of motion without pain and neck supple.     Right lower leg: No edema.     Left lower leg: No edema.   Skin:    General: Skin is warm.     Capillary Refill: Capillary refill takes less than 2 seconds.  Neurological:     General: No focal deficit present.     Mental Status: He is alert, oriented to person, place, and time and easily aroused.  Mental status is at baseline.     GCS: GCS eye subscore is 4. GCS verbal subscore is 5. GCS motor subscore is 6.     Motor: No weakness.  Psychiatric:        Attention and Perception: Attention and perception normal.        Mood and Affect: Mood and affect normal.        Speech: Speech normal.        Behavior: Behavior normal. Behavior is cooperative.        Thought Content: Thought content normal. Thought content does not include homicidal or suicidal ideation. Thought content does not include homicidal or suicidal plan.        Cognition and Memory: Cognition and memory normal.        Judgment: Judgment normal.        05/17/2023    2:22 PM 05/12/2023   12:01 PM 11/25/2022    2:24 PM  Depression screen PHQ 2/9  Decreased Interest 0 0 0  Down, Depressed, Hopeless 0 0 0  PHQ - 2 Score 0 0 0  Altered sleeping 3 3 3   Tired, decreased energy 0 1 3  Change in appetite 0 0 0  Feeling bad or failure about yourself  0 0 0  Trouble concentrating 0 0 0  Moving slowly or fidgety/restless 0  1  Suicidal thoughts 0  0  PHQ-9 Score 3 4 7   Difficult doing work/chores  Very difficult Very difficult      05/12/2023   12:01 PM 11/25/2022    2:24 PM  GAD 7 : Generalized Anxiety Score  Nervous, Anxious, on Edge 0 0  Control/stop worrying 0 1  Worry too much - different things 0 1  Trouble relaxing 0 3  Restless 0 0  Easily annoyed or irritable 0 1  Afraid - awful might happen 0 2  Total GAD 7 Score 0 8  Anxiety Difficulty Not difficult at all Very difficult   Assessment & Plan:  1. Other insomnia (Primary) Improving with seroquel. Patient to continue current regimen. Will follow up in July with repeat labs and will collect EKG at that time.  - CBC  with Differential/Platelet - CMP14+EGFR  2. Elevated glucose level Labs as below. Will communicate results to patient once available. Will await results to determine next steps.  - Bayer DCA Hb A1c Waived - VITAMIN D 25 Hydroxy (Vit-D Deficiency, Fractures)  3. Mixed hyperlipidemia Labs as below. Will communicate results to patient once available. Will await results to determine next steps.  Not fasting  - Lipid panel  4. Class 1 obesity due to excess calories with serious comorbidity and body mass index (BMI) of 32.0 to 32.9 in adult Praised patient on the diet and lifestyle modifications he is making. Discussed with patient to continue healthy lifestyle choices, including diet (rich in fruits, vegetables, and lean proteins, and low in salt and simple carbohydrates) and exercise (at least 30 minutes of moderate physical activity daily). Limit beverages high is sugar. Recommended at least 80-100 oz of water daily.  Labs as below. Will communicate results to patient once available. Will await results to determine next steps.  - VITAMIN D 25 Hydroxy (Vit-D Deficiency, Fractures)  5. Erectile dysfunction, unspecified erectile dysfunction type Patient would like to try lower dose of viagra. Does not wish to have side effects. Will send in medication as below. Will request eye exam records.  - sildenafil (VIAGRA) 25 MG tablet; Take 1 tablet (25  mg total) by mouth daily as needed for erectile dysfunction.  Dispense: 10 tablet; Refill: 0  6. Genital herpes simplex type 2 Well controlled. Continue current regimen.   The above assessment and management plan was discussed with the patient. The patient verbalized understanding of and has agreed to the management plan using shared-decision making. Patient is aware to call the clinic if they develop any new symptoms or if symptoms fail to improve or worsen. Patient is aware when to return to the clinic for a follow-up visit. Patient educated on when it  is appropriate to go to the emergency department.   Return in about 4 months (around 09/22/2023) for Chronic Condition Follow up.   Neale Burly, DNP-FNP [facility] [address] [place], [place] [address] [phone]

## 2023-05-18 ENCOUNTER — Encounter: Payer: Self-pay | Admitting: Family Medicine

## 2023-05-18 LAB — CBC WITH DIFFERENTIAL/PLATELET
Basophils Absolute: 0.1 10*3/uL (ref 0.0–0.2)
Basos: 1 %
EOS (ABSOLUTE): 0.4 10*3/uL (ref 0.0–0.4)
Eos: 4 %
Hematocrit: 45.1 % (ref 37.5–51.0)
Hemoglobin: 15 g/dL (ref 13.0–17.7)
Immature Grans (Abs): 0 10*3/uL (ref 0.0–0.1)
Immature Granulocytes: 1 %
Lymphocytes Absolute: 3.3 10*3/uL — ABNORMAL HIGH (ref 0.7–3.1)
Lymphs: 37 %
MCH: 29.1 pg (ref 26.6–33.0)
MCHC: 33.3 g/dL (ref 31.5–35.7)
MCV: 88 fL (ref 79–97)
Monocytes Absolute: 0.8 10*3/uL (ref 0.1–0.9)
Monocytes: 9 %
Neutrophils Absolute: 4.4 10*3/uL (ref 1.4–7.0)
Neutrophils: 48 %
Platelets: 322 10*3/uL (ref 150–450)
RBC: 5.15 x10E6/uL (ref 4.14–5.80)
RDW: 13.7 % (ref 11.6–15.4)
WBC: 8.9 10*3/uL (ref 3.4–10.8)

## 2023-05-18 LAB — LIPID PANEL
Chol/HDL Ratio: 6.4 ratio — ABNORMAL HIGH (ref 0.0–5.0)
Cholesterol, Total: 192 mg/dL (ref 100–199)
HDL: 30 mg/dL — ABNORMAL LOW (ref >39)
LDL Chol Calc (NIH): 82 mg/dL (ref 0–99)
Triglycerides: 498 mg/dL — ABNORMAL HIGH (ref 0–149)
VLDL Cholesterol Cal: 80 mg/dL — ABNORMAL HIGH (ref 5–40)

## 2023-05-18 LAB — VITAMIN D 25 HYDROXY (VIT D DEFICIENCY, FRACTURES): Vit D, 25-Hydroxy: 34.5 ng/mL (ref 30.0–100.0)

## 2023-05-18 LAB — CMP14+EGFR
ALT: 37 IU/L (ref 0–44)
AST: 22 IU/L (ref 0–40)
Albumin: 4.3 g/dL (ref 3.8–4.9)
Alkaline Phosphatase: 78 IU/L (ref 44–121)
BUN/Creatinine Ratio: 17 (ref 9–20)
BUN: 14 mg/dL (ref 6–24)
Bilirubin Total: 0.6 mg/dL (ref 0.0–1.2)
CO2: 22 mmol/L (ref 20–29)
Calcium: 9.7 mg/dL (ref 8.7–10.2)
Chloride: 106 mmol/L (ref 96–106)
Creatinine, Ser: 0.83 mg/dL (ref 0.76–1.27)
Globulin, Total: 2 g/dL (ref 1.5–4.5)
Glucose: 102 mg/dL — ABNORMAL HIGH (ref 70–99)
Potassium: 4.6 mmol/L (ref 3.5–5.2)
Sodium: 143 mmol/L (ref 134–144)
Total Protein: 6.3 g/dL (ref 6.0–8.5)
eGFR: 102 mL/min/{1.73_m2} (ref >59)

## 2023-05-18 NOTE — Progress Notes (Signed)
 A1C in prediabetes range. Recommendhealthy lifestyle choices, including diet (rich in fruits, vegetables, and lean proteins, and low in salt and simple carbohydrates) and exercise (at least 30 minutes of moderate physical activity daily). Limit beverages high is sugar. Recommended at least 80-100 oz of water daily. Cholesterol is elevated. Diet encouraged - increase intake of fresh fruits and vegetables, increase intake of lean proteins. Bake, broil, or grill foods. Avoid fried, greasy, and fatty foods. Avoid fast foods. Increase intake of fiber-rich whole grains. Exercise encouraged - at least 150 minutes per week and advance as tolerated. Would like for patient to start crestor based on ASCVD risk, if agreeable. The 10-year ASCVD risk score (Arnett DK, et al., 2019) is: 8.4%

## 2023-06-03 ENCOUNTER — Other Ambulatory Visit: Payer: Self-pay | Admitting: Family Medicine

## 2023-06-03 DIAGNOSIS — G4709 Other insomnia: Secondary | ICD-10-CM

## 2023-06-03 DIAGNOSIS — G4726 Circadian rhythm sleep disorder, shift work type: Secondary | ICD-10-CM

## 2023-07-12 ENCOUNTER — Other Ambulatory Visit: Payer: BC Managed Care – PPO

## 2023-07-18 ENCOUNTER — Encounter: Payer: Self-pay | Admitting: *Deleted

## 2023-09-05 ENCOUNTER — Other Ambulatory Visit: Payer: Self-pay | Admitting: Family Medicine

## 2023-09-05 DIAGNOSIS — G4709 Other insomnia: Secondary | ICD-10-CM

## 2023-09-05 DIAGNOSIS — G4726 Circadian rhythm sleep disorder, shift work type: Secondary | ICD-10-CM

## 2023-09-19 ENCOUNTER — Ambulatory Visit: Admitting: Nurse Practitioner

## 2023-09-19 ENCOUNTER — Telehealth: Payer: Self-pay

## 2023-09-19 ENCOUNTER — Ambulatory Visit: Payer: BC Managed Care – PPO | Admitting: Family Medicine

## 2023-09-19 NOTE — Telephone Encounter (Signed)
 Copied from CRM [id number]. Topic: General - Other >> Sep 19, 2023  1:11 PM Santiya F wrote: Reason for CRM: Patient is calling in because the provider he was supposed to see called out and his appointment had to be cancelled. Rescheduled patient for 10/09/23 and patient says he feels discarded due to his previous provider leaving and his appointment for today was cancelled. Patient says he is not complaining, but he wanted to make someone aware of his feelings. Patient says his previous provider had been helping him with his sleep issues as well as his FMLA and he wants to be sure the new provider will be able to provide the same level of treatment.

## 2023-09-19 NOTE — Telephone Encounter (Signed)
 Please r/s pt apt when he calls back

## 2023-10-09 ENCOUNTER — Ambulatory Visit: Admitting: Nurse Practitioner

## 2023-10-09 ENCOUNTER — Telehealth: Payer: Self-pay | Admitting: Family Medicine

## 2023-10-09 DIAGNOSIS — R7303 Prediabetes: Secondary | ICD-10-CM | POA: Insufficient documentation

## 2023-10-09 NOTE — Telephone Encounter (Signed)
 Pt called back today regarding appt being cancelled on 7/1 with Nena and then today his appt had been only scheduled in a 15 min slot so cancelled again. Please see below from CRM

## 2023-10-09 NOTE — Telephone Encounter (Signed)
 Spoke with patient and scheduled him for a re-establish care appointment. Patient was not upset just concerned because we have rescheduled him a couple of times. He was happy with the care he was receiving with Marry and wants to ensure he will be able to continue the path he and Marry were on with his chronic issues as she helped him tremendously.

## 2023-10-09 NOTE — Progress Notes (Deleted)
 Established Patient Office Visit  Subjective  Patient ID: Logan Mckee, male    DOB: [birth year]  Age: 58 y.o. MRN: [id number]  No chief complaint on file.   HPI [place] Lipid/Cholesterol, Follow-up  Last lipid panel Other pertinent labs  Lab Results  Component Value Date   CHOL 192 05/17/2023   HDL 30 (L) 05/17/2023   LDLCALC 82 05/17/2023   TRIG 498 (H) 05/17/2023   CHOLHDL 6.4 (H) 05/17/2023   Lab Results  Component Value Date   ALT 37 05/17/2023   AST 22 05/17/2023   PLT 322 05/17/2023   TSH 3.940 09/20/2021     He was last seen for this {1-12:18279} {days/wks/mos/yrs:310907} ago.  Management since that visit includes ***.  He reports {excellent/good/fair/poor:19665} compliance with treatment. He {is/is not:9024} having side effects. {document side effects if present:1}  Symptoms: {Yes/No:20286} chest pain {Yes/No:20286} chest pressure/discomfort  {Yes/No:20286} dyspnea {Yes/No:20286} lower extremity edema  {Yes/No:20286} numbness or tingling of extremity {Yes/No:20286} orthopnea  {Yes/No:20286} palpitations {Yes/No:20286} paroxysmal nocturnal dyspnea  {Yes/No:20286} speech difficulty {Yes/No:20286} syncope   Current diet: {diet habits:16563} Current exercise: {exercise types:16438}  The 10-year ASCVD risk score (Arnett DK, et al., 2019) is: 8.4%  The patient is a [age]-year-old [gender] with a history of prediabetes, diagnosed [X]  months/years ago. They have been managing their blood sugar levels through diet and exercise but have not yet required medication. The patient has no symptoms of hyperglycemia, such as excessive thirst, frequent urination, or fatigue, but is concerned about the progression of their condition. They are due for a repeat A1c to assess their current status and determine if further intervention is needed   .  Patient Active Problem List   Diagnosis Date Noted   Class 1 obesity due to excess calories with serious comorbidity and body  mass index (BMI) of 32.0 to 32.9 in adult 05/17/2023   Other insomnia 08/25/2021   Overweight (BMI 25.0-29.9) 12/01/2016   Ureteral stone with hydronephrosis 05/31/2016   Hyperlipidemia 10/13/2014   Genital herpes simplex type 2 10/10/2014   Erectile dysfunction 09/10/2013   Past Medical History:  Diagnosis Date   Acute right flank pain    ED (erectile dysfunction)    HSV-2 infection    Hyperlipidemia    Nephrolithiasis    bilateral non-obstructive right x2 , left x1  per dr alvaro note (urologist) 05-31-2016   Right ureteral stone    Past Surgical History:  Procedure Laterality Date   CYSTOSCOPY W/ URETERAL STENT PLACEMENT Right 05/31/2016   Procedure: CYSTOSCOPY WITH RETROGRADE PYELOGRAM/ RIGHT URETERAL STENT PLACEMENT;  Surgeon: Ricardo alvaro, MD;  Location: [facility];  Service: Urology;  Laterality: Right;   CYSTOSCOPY/URETEROSCOPY/HOLMIUM LASER/STENT PLACEMENT Right 06/08/2016   Procedure: CYSTOSCOPY RIGHT RETROGRADE WITH RIGHT URETEROSCOPY RIGHT STENT EXCHANGE;  Surgeon: Ricardo alvaro, MD;  Location: [facility];  Service: Urology;  Laterality: Right;   EXTRACORPOREAL SHOCK WAVE LITHOTRIPSY Left 02/01/2010   HOLMIUM LASER APPLICATION  06/08/2016   Procedure: HOLMIUM LASER APPLICATION;  Surgeon: Ricardo alvaro, MD;  Location: [facility];  Service: Urology;;   Social History   Tobacco Use   Smoking status: Never   Smokeless tobacco: Never  Substance Use Topics   Alcohol use: Yes    Comment: OCCASSIONAL   Drug use: No   Social History   Socioeconomic History   Marital status: Single    Spouse name: Not on file   Number of children: Not on file   Years of education: Not on file  Highest education level: Not on file  Occupational History   Not on file  Tobacco Use   Smoking status: Never   Smokeless tobacco: Never  Substance and Sexual Activity   Alcohol use: Yes    Comment: OCCASSIONAL   Drug use: No   Sexual activity: Not on file   Other Topics Concern   Not on file  Social History Narrative   Not on file   Social Drivers of Health   Financial Resource Strain: Not on file  Food Insecurity: Not on file  Transportation Needs: Not on file  Physical Activity: Not on file  Stress: Not on file  Social Connections: Not on file  Intimate Partner Violence: Not on file   Family Status  Relation Name Status   Mother  Alive   Father  Deceased at age 16   Sister  Alive   Brother  Alive   Sister  Alive  No partnership data on file   Family History  Problem Relation Age of Onset   Cancer Father 1       Thyroid  Cancer   Allergies  Allergen Reactions   Demerol [Meperidine] Rash      ROS Negative unless indicated in HPI   Objective:     There were no vitals taken for this visit. BP Readings from Last 3 Encounters:  05/17/23 118/67  05/12/23 (!) 148/81  11/25/22 (!) 149/83   Wt Readings from Last 3 Encounters:  05/17/23 251 lb (113.9 kg)  05/12/23 250 lb (113.4 kg)  11/25/22 246 lb 3.2 oz (111.7 kg)      Physical Exam   No results found for any visits on 10/09/23.  Last CBC Lab Results  Component Value Date   WBC 8.9 05/17/2023   HGB 15.0 05/17/2023   HCT 45.1 05/17/2023   MCV 88 05/17/2023   MCH 29.1 05/17/2023   RDW 13.7 05/17/2023   PLT 322 05/17/2023   Last metabolic panel Lab Results  Component Value Date   GLUCOSE 102 (H) 05/17/2023   NA 143 05/17/2023   K 4.6 05/17/2023   CL 106 05/17/2023   CO2 22 05/17/2023   BUN 14 05/17/2023   CREATININE 0.83 05/17/2023   EGFR 102 05/17/2023   CALCIUM  9.7 05/17/2023   PROT 6.3 05/17/2023   ALBUMIN 4.3 05/17/2023   LABGLOB 2.0 05/17/2023   AGRATIO 2.2 09/20/2021   BILITOT 0.6 05/17/2023   ALKPHOS 78 05/17/2023   AST 22 05/17/2023   ALT 37 05/17/2023   Last lipids Lab Results  Component Value Date   CHOL 192 05/17/2023   HDL 30 (L) 05/17/2023   LDLCALC 82 05/17/2023   TRIG 498 (H) 05/17/2023   CHOLHDL 6.4 (H) 05/17/2023    Last hemoglobin A1c Lab Results  Component Value Date   HGBA1C 5.7 (H) 05/17/2023   Last thyroid  functions Lab Results  Component Value Date   TSH 3.940 09/20/2021   T4TOTAL 7.7 10/10/2014        Assessment & Plan:  There are no diagnoses linked to this encounter. Continue healthy lifestyle choices, including diet (rich in fruits, vegetables, and lean proteins, and low in salt and simple carbohydrates) and exercise (at least 30 minutes of moderate physical activity daily).     The above assessment and management plan was discussed with the patient. The patient verbalized understanding of and has agreed to the management plan. Patient is aware to call the clinic if they develop any new symptoms or if symptoms persist or  worsen. Patient is aware when to return to the clinic for a follow-up visit. Patient educated on when it is appropriate to go to the emergency department.  No follow-ups on file.    Heavenleigh Petruzzi [place] Thompson, DNP [facility] [address] [place], [place] [address] [phone]    Note: This document was prepared by [employer] voice dictation technology and any errors that results from this process are unintentional.

## 2023-10-23 ENCOUNTER — Encounter: Payer: Self-pay | Admitting: Nurse Practitioner

## 2023-10-23 ENCOUNTER — Ambulatory Visit: Admitting: Nurse Practitioner

## 2023-10-23 VITALS — BP 135/81 | HR 100 | Temp 97.4°F | Ht 73.0 in

## 2023-10-23 DIAGNOSIS — Z125 Encounter for screening for malignant neoplasm of prostate: Secondary | ICD-10-CM | POA: Diagnosis not present

## 2023-10-23 DIAGNOSIS — G4709 Other insomnia: Secondary | ICD-10-CM

## 2023-10-23 DIAGNOSIS — E6609 Other obesity due to excess calories: Secondary | ICD-10-CM

## 2023-10-23 DIAGNOSIS — E785 Hyperlipidemia, unspecified: Secondary | ICD-10-CM

## 2023-10-23 DIAGNOSIS — G4700 Insomnia, unspecified: Secondary | ICD-10-CM | POA: Diagnosis not present

## 2023-10-23 DIAGNOSIS — E66811 Obesity, class 1: Secondary | ICD-10-CM

## 2023-10-23 DIAGNOSIS — Z6832 Body mass index (BMI) 32.0-32.9, adult: Secondary | ICD-10-CM | POA: Diagnosis not present

## 2023-10-23 DIAGNOSIS — R7303 Prediabetes: Secondary | ICD-10-CM | POA: Diagnosis not present

## 2023-10-23 DIAGNOSIS — G4726 Circadian rhythm sleep disorder, shift work type: Secondary | ICD-10-CM

## 2023-10-23 DIAGNOSIS — Z1329 Encounter for screening for other suspected endocrine disorder: Secondary | ICD-10-CM | POA: Diagnosis not present

## 2023-10-23 MED ORDER — QUETIAPINE FUMARATE 25 MG PO TABS: 25.0000 mg | ORAL_TABLET | Freq: Every day | ORAL | 0 refills | Status: DC

## 2023-10-23 MED ORDER — METFORMIN HCL 500 MG PO TABS: 500.0000 mg | ORAL_TABLET | Freq: Two times a day (BID) | ORAL | 3 refills | Status: DC

## 2023-10-23 MED ORDER — ATORVASTATIN CALCIUM 10 MG PO TABS: 10.0000 mg | ORAL_TABLET | Freq: Every day | ORAL | 0 refills | Status: DC

## 2023-10-23 NOTE — Progress Notes (Unsigned)
 Established Patient Office Visit  Subjective  Patient ID: Logan Mckee, male    DOB: December 04, 1965  Age: 58 y.o. MRN: [id number]  Chief Complaint  Patient presents with  . Establish Care    Re establish from [name]     HPI Logan Mckee is a 58 yrs old male presents 10/23/2023 to re-established care.  He is obese  259 lbs and reports he was started on Seroquel  25 mg  and was looking int having it increase  I have drink 1/2 can of energy drink, soft drink and cookies 2-3 on my way to work. PMH of prediabetes,   He is currently on Seroquel  for sleep reports that  it the only medication that works for me, they put me on Trazodone  50 mg and it was not doing anything took 4 pills and still not bale to sleep reports tried Doxepin  6 mg  took 4 pills and wa sup fro 21 hrs.  Flowsheet Row Office Visit from 10/23/2023 in [facility]  PHQ-9 Total Score 5       10/23/2023    3:48 PM 05/12/2023   12:01 PM 11/25/2022    2:24 PM  GAD 7 : Generalized Anxiety Score  Nervous, Anxious, on Edge 0 0 0  Control/stop worrying 0 0 1  Worry too much - different things 0 0 1  Trouble relaxing 1 0 3  Restless 0 0 0  Easily annoyed or irritable 0 0 1  Afraid - awful might happen 0 0 2  Total GAD 7 Score 1 0 8  Anxiety Difficulty Somewhat difficult Not difficult at all Very difficult     Patient Active Problem List   Diagnosis Date Noted  . Prediabetes 10/09/2023  . Class 1 obesity due to excess calories with serious comorbidity and body mass index (BMI) of 32.0 to 32.9 in adult 05/17/2023  . Other insomnia 08/25/2021  . Ureteral stone with hydronephrosis 05/31/2016  . Hyperlipidemia 10/13/2014  . 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 Erectile dysfunction 09/10/2013   Past Medical History:  Diagnosis Date  . Acute right flank pain   . ED (erectile dysfunction)   . HSV-2 infection   . Hyperlipidemia   . Nephrolithiasis    bilateral non-obstructive right x2  , left x1  per dr alvaro note (urologist) 05-31-2016  . Right ureteral stone    Past Surgical History:  Procedure Laterality Date  . CYSTOSCOPY W/ URETERAL STENT PLACEMENT Right 05/31/2016   Procedure: CYSTOSCOPY WITH RETROGRADE PYELOGRAM/ RIGHT URETERAL STENT PLACEMENT;  Surgeon: Ricardo alvaro, MD;  Location: [facility];  Service: Urology;  Laterality: Right;  . CYSTOSCOPY/URETEROSCOPY/HOLMIUM LASER/STENT PLACEMENT Right 06/08/2016   Procedure: CYSTOSCOPY RIGHT RETROGRADE WITH RIGHT URETEROSCOPY RIGHT STENT EXCHANGE;  Surgeon: Ricardo alvaro, MD;  Location[facility];  Service: Urology;  Laterality: Right;  . EXTRACORPOREAL SHOCK WAVE LITHOTRIPSY Left 02/01/2010  . HOLMIUM LASER APPLICATION  06/08/2016   Procedure: HOLMIUM LASER APPLICATION;  Surgeon: Ricardo alvaro, MD;  Location: [facility];  Service: Urology;;   Social History   Tobacco Use  . Smoking status: Never  . Smokeless tobacco: Never  Substance Use Topics  . Alcohol use: Yes    Comment: OCCASSIONAL  . Drug use: No   Social History   Socioeconomic History  . Marital status: Single    Spouse name: Not on file  . Number of children: Not on file  . Years of education: Not on file  .  Highest education level: Not on file  Occupational History  . Not on file  Tobacco Use  . Smoking status: Never  . Smokeless tobacco: Never  Substance and Sexual Activity  . Alcohol use: Yes    Comment: OCCASSIONAL  . Drug use: No  . Sexual activity: Not on file  Other Topics Concern  . Not on file  Social History Narrative  . Not on file   Social Drivers of Health   Financial Resource Strain: Not on file  Food Insecurity: No Food Insecurity (10/23/2023)   Hunger Vital Sign   . Worried About Programme researcher, broadcasting/film/video in the Last Year: Never true   . Ran Out of Food in the Last Year: Never true  Transportation Needs: No Transportation Needs (10/23/2023)   PRAPARE - Transportation   . Lack of Transportation  (Medical): No   . Lack of Transportation (Non-Medical): No  Physical Activity: Inactive (10/23/2023)   Exercise Vital Sign   . Days of Exercise per Week: 0 days   . Minutes of Exercise per Session: 0 min  Stress: Not on file  Social Connections: Not on file  Intimate Partner Violence: Not on file   Family Status  Relation Name Status  . Mother  Alive  . Father  Deceased at age 26  . Sister  Alive  . Brother  Alive  . Sister  Alive  No partnership data on file   Family History  Problem Relation Age of Onset  . Cancer Father 77       Thyroid  Cancer   Allergies  Allergen Reactions  . Demerol [Meperidine] Rash      Review of Systems  Constitutional:  Negative for chills and fever.  HENT:  Negative for congestion and sore throat.   Respiratory:  Negative for cough, shortness of breath and wheezing.   Cardiovascular:  Negative for chest pain and leg swelling.  Gastrointestinal:  Negative for abdominal pain, blood in stool, diarrhea, nausea and vomiting.  Musculoskeletal:  Negative for falls.  Skin:  Negative for itching and rash.  Neurological:  Negative for dizziness and headaches.   Negative unless indicated in HPI   Objective:     BP 135/81   Pulse 100   Temp (!) 97.4 F (36.3 C) (Temporal)   Ht 6' 1 (1.854 m)   SpO2 96%   BMI 33.12 kg/m  BP Readings from Last 3 Encounters:  10/23/23 135/81  05/17/23 118/67  05/12/23 (!) 148/81   Wt Readings from Last 3 Encounters:  05/17/23 251 lb (113.9 kg)  05/12/23 250 lb (113.4 kg)  11/25/22 246 lb 3.2 oz (111.7 kg)      Physical Exam Vitals and nursing note reviewed.  Constitutional:      General: He is not in acute distress. HENT:     Head: Normocephalic and atraumatic.     Right Ear: Tympanic membrane, ear canal and external ear normal. There is no impacted cerumen.     Left Ear: Tympanic membrane, ear canal and external ear normal. There is no impacted cerumen.     Nose: Nose normal.     Mouth/Throat:      Mouth: Mucous membranes are moist.  Eyes:     General: No scleral icterus.    Extraocular Movements: Extraocular movements intact.     Conjunctiva/sclera: Conjunctivae normal.     Pupils: Pupils are equal, round, and reactive to light.  Cardiovascular:     Heart sounds: Normal heart sounds.  Pulmonary:  Effort: Pulmonary effort is normal.     Breath sounds: Normal breath sounds.  Musculoskeletal:        General: Normal range of motion.     Right lower leg: No edema.     Left lower leg: No edema.  Skin:    General: Skin is warm and dry.     Findings: No rash.  Neurological:     Mental Status: He is alert and oriented to person, place, and time.  Psychiatric:        Mood and Affect: Mood normal.        Behavior: Behavior normal.        Thought Content: Thought content normal.        Judgment: Judgment normal.      No results found for any visits on 10/23/23.  Last CBC Lab Results  Component Value Date   WBC 8.9 05/17/2023   HGB 15.0 05/17/2023   HCT 45.1 05/17/2023   MCV 88 05/17/2023   MCH 29.1 05/17/2023   RDW 13.7 05/17/2023   PLT 322 05/17/2023   Last metabolic panel Lab Results  Component Value Date   GLUCOSE 102 (H) 05/17/2023   NA 143 05/17/2023   K 4.6 05/17/2023   CL 106 05/17/2023   CO2 22 05/17/2023   BUN 14 05/17/2023   CREATININE 0.83 05/17/2023   EGFR 102 05/17/2023   CALCIUM  9.7 05/17/2023   PROT 6.3 05/17/2023   ALBUMIN 4.3 05/17/2023   LABGLOB 2.0 05/17/2023   AGRATIO 2.2 09/20/2021   BILITOT 0.6 05/17/2023   ALKPHOS 78 05/17/2023   AST 22 05/17/2023   ALT 37 05/17/2023   Last lipids Lab Results  Component Value Date   CHOL 192 05/17/2023   HDL 30 (L) 05/17/2023   LDLCALC 82 05/17/2023   TRIG 498 (H) 05/17/2023   CHOLHDL 6.4 (H) 05/17/2023   Last hemoglobin A1c Lab Results  Component Value Date   HGBA1C 5.7 (H) 05/17/2023   Last thyroid  functions Lab Results  Component Value Date   TSH 3.940 09/20/2021   T4TOTAL 7.7  10/10/2014        Assessment & Plan:  Hyperlipidemia, unspecified hyperlipidemia type -     Lipid panel -     Atorvastatin  Calcium ; Take 1 tablet (10 mg total) by mouth daily.  Dispense: 90 tablet; Refill: 0  Class 1 obesity due to excess calories with serious comorbidity and body mass index (BMI) of 32.0 to 32.9 in adult -     Lipid panel -     Bayer DCA Hb A1c Waived -     Thyroid  Panel With TSH -     metFORMIN  HCl; Take 1 tablet (500 mg total) by mouth 2 (two) times daily with a meal.  Dispense: 180 tablet; Refill: 3  Prediabetes -     Bayer DCA Hb A1c Waived -     metFORMIN  HCl; Take 1 tablet (500 mg total) by mouth 2 (two) times daily with a meal.  Dispense: 180 tablet; Refill: 3  Screening PSA (prostate specific antigen) -     PSA, total and free  Other insomnia -     QUEtiapine  Fumarate; Take 1 tablet (25 mg total) by mouth at bedtime. Dx G47.26, G47.09  Dispense: 90 tablet; Refill: 0  Insomnia, unspecified type -     QUEtiapine  Fumarate; Take 1 tablet (25 mg total) by mouth at bedtime. Dx G47.26, G47.09  Dispense: 90 tablet; Refill: 0  Shift work sleep disorder -  QUEtiapine  Fumarate; Take 1 tablet (25 mg total) by mouth at bedtime. Dx G47.26, G47.09  Dispense: 90 tablet; Refill: 0   Ridwan is a 58 yrs old Caucasian male seen today fro chronic disease management, no acute distress  Prediabetes obese   Continue healthy lifestyle choices, including diet (rich in fruits, vegetables, and lean proteins, and low in salt and simple carbohydrates) and exercise (at least 30 minutes of moderate physical activity daily).     The above assessment and management plan was discussed with the patient. The patient verbalized understanding of and has agreed to the management plan. Patient is aware to call the clinic if they develop any new symptoms or if symptoms persist or worsen. Patient is aware when to return to the clinic for a follow-up visit. Patient educated on when it is  appropriate to go to the emergency department.  No follow-ups on file.    Strider Vallance [place] Thompson, DNP [facility] [address] [place], [place] [address] [phone]    Note: This document was prepared by [employer] voice dictation technology and any errors that results from this process are unintentional.

## 2023-10-24 LAB — LIPID PANEL
Chol/HDL Ratio: 6.3 ratio — ABNORMAL HIGH (ref 0.0–5.0)
Cholesterol, Total: 213 mg/dL — ABNORMAL HIGH (ref 100–199)
HDL: 34 mg/dL — ABNORMAL LOW (ref >39)
LDL Chol Calc (NIH): 89 mg/dL (ref 0–99)
Triglycerides: 549 mg/dL — ABNORMAL HIGH (ref 0–149)
VLDL Cholesterol Cal: 90 mg/dL — ABNORMAL HIGH (ref 5–40)

## 2023-10-24 LAB — THYROID PANEL WITH TSH
Free Thyroxine Index: 1.7 (ref 1.2–4.9)
T3 Uptake Ratio: 24 % (ref 24–39)
T4, Total: 7.2 ug/dL (ref 4.5–12.0)
TSH: 3.34 u[IU]/mL (ref 0.450–4.500)

## 2023-10-24 LAB — BAYER DCA HB A1C WAIVED: HB A1C (BAYER DCA - WAIVED): 6 % — ABNORMAL HIGH (ref 4.8–5.6)

## 2023-10-24 LAB — PSA, TOTAL AND FREE
PSA, Free Pct: 16.5 %
PSA, Free: 0.28 ng/mL
Prostate Specific Ag, Serum: 1.7 ng/mL (ref 0.0–4.0)

## 2023-10-25 ENCOUNTER — Ambulatory Visit: Payer: Self-pay | Admitting: Nurse Practitioner

## 2023-10-25 DIAGNOSIS — E781 Pure hyperglyceridemia: Secondary | ICD-10-CM | POA: Insufficient documentation

## 2023-10-25 DIAGNOSIS — E782 Mixed hyperlipidemia: Secondary | ICD-10-CM

## 2023-10-25 MED ORDER — OMEGA-3-ACID ETHYL ESTERS 1 G PO CAPS: 1.0000 g | ORAL_CAPSULE | Freq: Two times a day (BID) | ORAL | 0 refills | Status: DC

## 2023-10-25 MED ORDER — FENOFIBRATE 48 MG PO TABS: 48.0000 mg | ORAL_TABLET | Freq: Every day | ORAL | 0 refills | Status: DC

## 2024-01-22 ENCOUNTER — Other Ambulatory Visit: Payer: Self-pay | Admitting: *Deleted

## 2024-01-22 DIAGNOSIS — E782 Mixed hyperlipidemia: Secondary | ICD-10-CM

## 2024-01-22 DIAGNOSIS — E781 Pure hyperglyceridemia: Secondary | ICD-10-CM

## 2024-01-25 NOTE — Progress Notes (Signed)
 Subjective:  Patient ID: Logan Mckee, male    DOB: [birth year], 58 y.o.   MRN: [id number]  Patient Care Team: Deitra Morton Sebastian Nena, NP as PCP - General (Nurse Practitioner)   Chief Complaint:  3 month follow up   HPI: Logan Mckee is a 58 y.o. male presenting on 01/29/2024 for 3 month follow up   Discussed the use of AI scribe software for clinical note transcription with the patient, who gave verbal consent to proceed.  History of Present Illness Logan Mckee is a 58 year old male with hyperlipidemia, prediabetes, and insomnia who presents for follow-up of his sleep and metabolic conditions.  His insomnia has improved since starting Seroquel . He works the night shift and finds the medication effective in helping him sleep and maintain his schedule without missing days or feeling unwell. Although the medication does not work as quickly as desired, it significantly improves his ability to rest. He takes the medication as prescribed and has created a conducive sleep environment with minimal light and noise.  He has a history of hyperlipidemia and was initially prescribed Lovaza , but insurance did not approve it. He instead uses over-the-counter fish oil. He occasionally forgets the second dose of his fish oil supplement, estimating a 92-93% adherence rate.  His lipid panel from October 23, 2023, showed cholesterol at 213 mg/dL, triglycerides at 450 mg/dL, HDL at 34 mg/dL, and normal LDL. He is u taking Tricor   Patient has a history of erectile dysfunction (ED), currently managed with sildenafil  (Viagra ) as needed. He reports that the medication has been effective in improving erectile function and satisfaction. He takes the medication approximately [insert frequency, e.g., once weekly or as needed prior to sexual activity] and denies any significant side effects such as headache, flushing, or visual changes. No new genitourinary symptoms are reported. He wishes to continue with the current  management plan.   Patient has a history of prediabetes and obesity, currently managed with metformin . Reports adherence to medication with no adverse effects noted. Most recent HbA1c was 6.0%, indicating persistent impaired glucose tolerance. Patient continues efforts with dietary modification and increased physical activity to support weight loss and glycemic control.   Relevant past medical, surgical, family, and social history reviewed and updated as indicated.  Allergies and medications reviewed and updated. Data reviewed: Chart in Epic.   Past Medical History:  Diagnosis Date   Acute right flank pain    ED (erectile dysfunction)    HSV-2 infection    Hyperlipidemia    Nephrolithiasis    bilateral non-obstructive right x2 , left x1  per dr alvaro note (urologist) 05-31-2016   Right ureteral stone     Past Surgical History:  Procedure Laterality Date   CYSTOSCOPY W/ URETERAL STENT PLACEMENT Right 05/31/2016   Procedure: CYSTOSCOPY WITH RETROGRADE PYELOGRAM/ RIGHT URETERAL STENT PLACEMENT;  Surgeon: Ricardo Alvaro, MD;  Location: [facility];  Service: Urology;  Laterality: Right;   CYSTOSCOPY/URETEROSCOPY/HOLMIUM LASER/STENT PLACEMENT Right 06/08/2016   Procedure: CYSTOSCOPY RIGHT RETROGRADE WITH RIGHT URETEROSCOPY RIGHT STENT EXCHANGE;  Surgeon: Ricardo Alvaro, MD;  Location: [facility];  Service: Urology;  Laterality: Right;   EXTRACORPOREAL SHOCK WAVE LITHOTRIPSY Left 02/01/2010   HOLMIUM LASER APPLICATION  06/08/2016   Procedure: HOLMIUM LASER APPLICATION;  Surgeon: Ricardo Alvaro, MD;  Location: [facility];  Service: Urology;;    Social History   Socioeconomic History   Marital status: Single    Spouse name: Not on file   Number of  children: Not on file   Years of education: Not on file   Highest education level: Not on file  Occupational History   Not on file  Tobacco Use   Smoking status: Never   Smokeless tobacco: Never  Substance and  Sexual Activity   Alcohol use: Yes    Comment: OCCASSIONAL   Drug use: No   Sexual activity: Not on file  Other Topics Concern   Not on file  Social History Narrative   Not on file   Social Drivers of Health   Financial Resource Strain: Not on file  Food Insecurity: No Food Insecurity (10/23/2023)   Hunger Vital Sign    Worried About Running Out of Food in the Last Year: Never true    Ran Out of Food in the Last Year: Never true  Transportation Needs: No Transportation Needs (10/23/2023)   PRAPARE - Administrator, Civil Service (Medical): No    Lack of Transportation (Non-Medical): No  Physical Activity: Inactive (10/23/2023)   Exercise Vital Sign    Days of Exercise per Week: 0 days    Minutes of Exercise per Session: 0 min  Stress: Not on file  Social Connections: Not on file  Intimate Partner Violence: Not on file    Outpatient Encounter Medications as of 01/29/2024  Medication Sig   diclofenac  (VOLTAREN ) 75 MG EC tablet Take 1 tablet (75 mg total) by mouth 2 (two) times daily.   fenofibrate  (TRICOR ) 48 MG tablet Take 1 tablet (48 mg total) by mouth daily.   fluticasone (FLONASE) 50 MCG/ACT nasal spray 2 sprays by Each Nare route daily.   metFORMIN  (GLUCOPHAGE ) 500 MG tablet Take 1 tablet (500 mg total) by mouth 2 (two) times daily with a meal.   omega-3 acid ethyl esters (LOVAZA ) 1 g capsule Take 1 capsule (1 g total) by mouth 2 (two) times daily.   QUEtiapine  (SEROQUEL ) 25 MG tablet Take 1 tablet (25 mg total) by mouth at bedtime. Dx G47.26, G47.09   sildenafil  (VIAGRA ) 25 MG tablet Take 1 tablet (25 mg total) by mouth daily as needed for erectile dysfunction.   [DISCONTINUED] atorvastatin  (LIPITOR) 10 MG tablet Take 1 tablet (10 mg total) by mouth daily.   [DISCONTINUED] fenofibrate  (TRICOR ) 48 MG tablet TAKE 1 TABLET BY MOUTH EVERY DAY   [DISCONTINUED] metFORMIN  (GLUCOPHAGE ) 500 MG tablet Take 1 tablet (500 mg total) by mouth 2 (two) times daily with a meal.    [DISCONTINUED] omega-3 acid ethyl esters (LOVAZA ) 1 g capsule Take 1 capsule (1 g total) by mouth 2 (two) times daily.   [DISCONTINUED] QUEtiapine  (SEROQUEL ) 25 MG tablet Take 1 tablet (25 mg total) by mouth at bedtime. Dx G47.26, G47.09   [DISCONTINUED] sildenafil  (VIAGRA ) 25 MG tablet Take 1 tablet (25 mg total) by mouth daily as needed for erectile dysfunction.   [DISCONTINUED] vitamin E 180 MG (400 UNITS) capsule Take 400 Units by mouth daily. (Patient not taking: Reported on 10/23/2023)   No facility-administered encounter medications on file as of 01/29/2024.    Allergies  Allergen Reactions   Demerol [Meperidine] Rash    Pertinent ROS per HPI, otherwise unremarkable      Objective:  BP 134/70   Pulse 94   Temp 97.9 F (36.6 C)   Ht 6' 1 (1.854 m)   Wt 254 lb 3.2 oz (115.3 kg)   SpO2 95%   BMI 33.54 kg/m    Wt Readings from Last 3 Encounters:  01/29/24 254 lb 3.2  oz (115.3 kg)  05/17/23 251 lb (113.9 kg)  05/12/23 250 lb (113.4 kg)   BP Readings from Last 3 Encounters:  01/29/24 134/70  10/23/23 135/81  05/17/23 118/67    Physical Exam Vitals and nursing note reviewed.  Constitutional:      Appearance: He is obese.  HENT:     Head: Normocephalic and atraumatic.     Nose: Nose normal.  Eyes:     General: No scleral icterus.    Extraocular Movements: Extraocular movements intact.     Conjunctiva/sclera: Conjunctivae normal.     Pupils: Pupils are equal, round, and reactive to light.  Cardiovascular:     Heart sounds: Normal heart sounds.  Pulmonary:     Effort: Pulmonary effort is normal.     Breath sounds: Normal breath sounds.  Skin:    General: Skin is warm and dry.     Findings: No rash.  Neurological:     Mental Status: He is alert and oriented to person, place, and time.  Psychiatric:        Attention and Perception: Attention and perception normal.        Mood and Affect: Mood normal.        Speech: Speech normal.        Behavior: Behavior  normal. Behavior is cooperative.        Thought Content: Thought content normal. Thought content does not include homicidal or suicidal ideation. Thought content does not include homicidal or suicidal plan.        Cognition and Memory: Cognition and memory normal.        Judgment: Judgment normal.    Physical Exam      Results for orders placed or performed in visit on 10/23/23  Bayer DCA Hb A1c Waived   Collection Time: 10/23/23  4:17 PM  Result Value Ref Range   HB A1C (BAYER DCA - WAIVED) 6.0 (H) 4.8 - 5.6 %  Lipid panel   Collection Time: 10/23/23  4:19 PM  Result Value Ref Range   Cholesterol, Total 213 (H) 100 - 199 mg/dL   Triglycerides 450 (H) 0 - 149 mg/dL   HDL 34 (L) >60 mg/dL   VLDL Cholesterol Cal 90 (H) 5 - 40 mg/dL   LDL Chol Calc (NIH) 89 0 - 99 mg/dL   Chol/HDL Ratio 6.3 (H) 0.0 - 5.0 ratio  Thyroid  Panel With TSH   Collection Time: 10/23/23  4:19 PM  Result Value Ref Range   TSH 3.340 0.450 - 4.500 uIU/mL   T4, Total 7.2 4.5 - 12.0 ug/dL   T3 Uptake Ratio 24 24 - 39 %   Free Thyroxine Index 1.7 1.2 - 4.9  PSA, total and free   Collection Time: 10/23/23  4:19 PM  Result Value Ref Range   Prostate Specific Ag, Serum 1.7 0.0 - 4.0 ng/mL   PSA, Free 0.28 N/A ng/mL   PSA, Free Pct 16.5 %       Pertinent labs & imaging results that were available during my care of the patient were reviewed by me and considered in my medical decision making.  Assessment & Plan:  Logun was seen today for 3 month follow up.  Diagnoses and all orders for this visit:  Hyperlipidemia, unspecified hyperlipidemia type -     fenofibrate  (TRICOR ) 48 MG tablet; Take 1 tablet (48 mg total) by mouth daily.  Hypertriglyceridemia -     omega-3 acid ethyl esters (LOVAZA ) 1 g capsule; Take 1 capsule (  1 g total) by mouth 2 (two) times daily. -     fenofibrate  (TRICOR ) 48 MG tablet; Take 1 tablet (48 mg total) by mouth daily.  Class 1 obesity due to excess calories with serious  comorbidity and body mass index (BMI) of 32.0 to 32.9 in adult -     metFORMIN  (GLUCOPHAGE ) 500 MG tablet; Take 1 tablet (500 mg total) by mouth 2 (two) times daily with a meal.  Prediabetes -     Bayer DCA Hb A1c Waived -     metFORMIN  (GLUCOPHAGE ) 500 MG tablet; Take 1 tablet (500 mg total) by mouth 2 (two) times daily with a meal.  Erectile dysfunction, unspecified erectile dysfunction type -     sildenafil  (VIAGRA ) 25 MG tablet; Take 1 tablet (25 mg total) by mouth daily as needed for erectile dysfunction.  Other insomnia -     QUEtiapine  (SEROQUEL ) 25 MG tablet; Take 1 tablet (25 mg total) by mouth at bedtime. Dx G47.26, G47.09  Insomnia, unspecified type -     QUEtiapine  (SEROQUEL ) 25 MG tablet; Take 1 tablet (25 mg total) by mouth at bedtime. Dx G47.26, G47.09  Shift work sleep disorder -     QUEtiapine  (SEROQUEL ) 25 MG tablet; Take 1 tablet (25 mg total) by mouth at bedtime. Dx G47.26, G47.09  Mixed hyperlipidemia -     omega-3 acid ethyl esters (LOVAZA ) 1 g capsule; Take 1 capsule (1 g total) by mouth 2 (two) times daily. -     fenofibrate  (TRICOR ) 48 MG tablet; Take 1 tablet (48 mg total) by mouth daily.     Assessment and Plan Elsie is a 58 year old Caucasian male seen today for chronic disease management, no acute distress Assessment & Plan Insomnia and circadian rhythm sleep disorder, shift work type Seroquel  effective for sleep, slower onset than desired. Maintains night shift schedule. - Continue Seroquel  25 mg oral at bedtime.  Hyperlipidemia Lovaza  not approved. Adheres to regimen 92-93%. Continue Tricor  45 mg daily refill provided  Prediabetes Check A1c Continue metformin  500 mg twice daily  Obesity Continue metformin  500 mg twice daily Lifestyle modification increase intake of fruits and vegetable   ED  continue Viagra  25 mg as needed refill provided  Continue all other maintenance medications.  Follow up plan: Return in about 4 months (around  05/28/2024) for Chronic disease management.   Continue healthy lifestyle choices, including diet (rich in fruits, vegetables, and lean proteins, and low in salt and simple carbohydrates) and exercise (at least 30 minutes of moderate physical activity daily).  Educational handout given for    Clinical References  BMI for Adults Body mass index (BMI) is a number found using a person's weight and height. BMI can help tell how much of a person's weight is made up of fat. BMI does not measure body fat directly. It is used instead of tests that directly measure body fat, which can be difficult and expensive. What are BMI measurements used for? BMI is useful to: Find out if your weight puts you at higher risk for medical problems. Help recommend changes, such as in diet and exercise. This can help you reach a healthy weight. BMI screening can be done again to see if these changes are working. How is BMI calculated? Your height and weight are measured. The BMI is found from those numbers. This can be done with [place] or metric measurements. Note that charts and online BMI calculators are available to help you find your BMI quickly and easily without  doing these calculations. To calculate your BMI in [place] measurements: Measure your weight in pounds (lb). Multiply the number of pounds by 703. So, for an adult who weighs 150 lb, multiply that number by 703: 150 x 703, which equals 105,450. Measure your height in inches. Then multiply that number by itself to get a measurement called inches squared. So, for an adult who is 70 inches tall, the inches squared measurement is 70 inches x 70 inches, which equals 4,900 inches squared. Divide the total from step 2 (number of lb x 703) by the total from step 3 (inches squared): 105,450  4,900 = 21.5. This is your BMI. To calculate your BMI in metric measurements:  Measure your weight in kilograms (kg). For this example, the weight is 70 kg. Measure your  height in meters (m). Then multiply that number by itself to get a measurement called meters squared. So, for an adult who is 1.75 m tall, the meters squared measurement is 1.75 m x 1.75 m, which equals 3.1 meters squared. Divide the number of kilograms (your weight) by the meters squared number. In this example: 70  3.1 = 22.6. This is your BMI. What do the results mean? BMI charts are used to see if you are underweight, normal weight, overweight, or obese. The following guidelines will be used: Underweight: BMI less than 18.5. Normal weight: BMI between 18.5 and 24.9. Overweight: BMI between 25 and 29.9. Obese: BMI of 30 or above. BMI is a tool and cannot diagnose a condition. Talk with your health care provider about what your BMI means for you. Keep these notes in mind: Weight includes fat and muscle. Someone with a muscular build, such as an athlete, may have a BMI that is higher than 24.9. In cases like these, BMI is not a correct measure of body fat. If you have a BMI of 25 or higher, your provider may need to do more testing to find out if excess body fat is the cause. BMI is measured the same way for males and females. Females usually have more body fat than males of the same height and weight. Where to find more information For more information about BMI, including tools to quickly find your BMI, go to: Centers for Disease Control and Prevention: tonerpromos.no American Heart Association: heart.org National Heart, Lung, and Blood Institute: buffalodrycleaner.gl This information is not intended to replace advice given to you by your health care provider. Make sure you discuss any questions you have with your health care provider. Document Revised: 11/25/2021 Document Reviewed: 11/18/2021 Elsevier Patient Education  2024 [employer]. Erectile Dysfunction Erectile dysfunction (ED) is the inability to get or keep an erection in order to have sexual intercourse. ED is considered a symptom of an  underlying disorder and is not considered a disease. ED may include: Inability to get an erection. Lack of enough hardness of the erection to allow penetration. Loss of erection before sex is finished. What are the causes? This condition may be caused by: Physical causes, such as: Artery problems. This may include heart disease, high blood pressure, atherosclerosis, and diabetes. Hormonal problems, such as low testosterone. Obesity. Nerve problems. This may include back or pelvic injuries, multiple sclerosis, Parkinson's disease, spinal cord injury, and stroke. Certain medicines, such as: Pain relievers. Antidepressants. Blood pressure medicines and water pills (diuretics). Cancer medicines. Antihistamines. Muscle relaxants. Lifestyle factors, such as: Use of drugs such as marijuana, cocaine, or opioids. Excessive use of alcohol. Smoking. Lack of physical activity or  exercise. Psychological causes, such as: Anxiety or stress. Sadness or depression. Exhaustion. Fear about sexual performance. Guilt. What are the signs or symptoms? Symptoms of this condition include: Inability to get an erection. Lack of enough hardness of the erection to allow penetration. Loss of the erection before sex is finished. Sometimes having normal erections, but with frequent unsatisfactory episodes. Low sexual satisfaction in either partner due to erection problems. A curved penis occurring with erection. The curve may cause pain, or the penis may be too curved to allow for intercourse. Never having nighttime or morning erections. How is this diagnosed? This condition is often diagnosed by: Performing a physical exam to find other diseases or specific problems with the penis. Asking you detailed questions about the problem. Doing tests, such as: Blood tests to check for diabetes mellitus or high cholesterol, or to measure hormone levels. Other tests to check for underlying health conditions. An  ultrasound exam to check for scarring. A test to check blood flow to the penis. Doing a sleep study at home to measure nighttime erections. How is this treated? This condition may be treated by: Medicines, such as: Medicine taken by mouth to help you achieve an erection (oral medicine). Hormone replacement therapy to replace low testosterone levels. Medicine that is injected into the penis. Your health care provider may instruct you how to give yourself these injections at home. Medicine that is delivered with a short applicator tube. The tube is inserted into the opening at the tip of the penis, which is the opening of the urethra. A tiny pellet of medicine is put in the urethra. The pellet dissolves and enhances erectile function. This is also called MUSE (medicated urethral system for erections) therapy. Vacuum pump. This is a pump with a ring on it. The pump and ring are placed on the penis and used to create pressure that helps the penis become erect. Penile implant surgery. In this procedure, you may receive: An inflatable implant. This consists of cylinders, a pump, and a reservoir. The cylinders can be inflated with a fluid that helps to create an erection, and they can be deflated after intercourse. A semi-rigid implant. This consists of two silicone rubber rods. The rods provide some rigidity. They are also flexible, so the penis can both curve downward in its normal position and become straight for sexual intercourse. Blood vessel surgery to improve blood flow to the penis. During this procedure, a blood vessel from a different part of the body is placed into the penis to allow blood to flow around (bypass) damaged or blocked blood vessels. Lifestyle changes, such as exercising more, losing weight, and quitting smoking. Follow these instructions at home: Medicines  Take over-the-counter and prescription medicines only as told by your health care provider. Do not increase the dosage  without first discussing it with your health care provider. If you are using self-injections, do injections as directed by your health care provider. Make sure you avoid any veins that are on the surface of the penis. After giving an injection, apply pressure to the injection site for 5 minutes. Talk to your health care provider about how to prevent headaches while taking ED medicines. These medicines may cause a sudden headache due to the increase in blood flow in your body. General instructions Exercise regularly, as directed by your health care provider. Work with your health care provider to lose weight, if needed. Do not use any products that contain nicotine or tobacco. These products include cigarettes, chewing  tobacco, and vaping devices, such as e-cigarettes. If you need help quitting, ask your health care provider. Before using a vacuum pump, read the instructions that come with the pump and discuss any questions with your health care provider. Keep all follow-up visits. This is important. Contact a health care provider if: You feel nauseous. You are vomiting. You get sudden headaches while taking ED medicines. You have any concerns about your sexual health. Get help right away if: You are taking oral or injectable medicines and you have an erection that lasts longer than 4 hours. If your health care provider is unavailable, go to the nearest emergency room for evaluation. An erection that lasts much longer than 4 hours can result in permanent damage to your penis. You have severe pain in your groin or abdomen. You develop redness or severe swelling of your penis. You have redness spreading at your groin or lower abdomen. You are unable to urinate. You experience chest pain or a rapid heartbeat (palpitations) after taking oral medicines. These symptoms may represent a serious problem that is an emergency. Do not wait to see if the symptoms will go away. Get medical help right away. Call  your local emergency services (911 in the [place] Do not drive yourself to the hospital. Summary Erectile dysfunction (ED) is the inability to get or keep an erection during sexual intercourse. This condition is diagnosed based on a physical exam, your symptoms, and tests to determine the cause. Treatment varies depending on the cause and may include medicines, hormone therapy, surgery, or a vacuum pump. You may need follow-up visits to make sure that you are using your medicines or devices correctly. Get help right away if you are taking or injecting medicines and you have an erection that lasts longer than 4 hours. This information is not intended to replace advice given to you by your health care provider. Make sure you discuss any questions you have with your health care provider. Document Revised: 06/03/2020 Document Reviewed: 06/03/2020 Elsevier Patient Education  2025 [employer]. Dyslipidemia Dyslipidemia is an imbalance of waxy, fat-like substances (lipids) in the blood. The body needs lipids in small amounts. Dyslipidemia often involves a high level of cholesterol or triglycerides, which are types of lipids. Common forms of dyslipidemia include: High levels of LDL cholesterol. LDL is the type of cholesterol that causes fatty deposits (plaques) to build up in the blood vessels that carry blood away from the heart (arteries). Low levels of HDL cholesterol. HDL cholesterol is the type of cholesterol that protects against heart disease. High levels of HDL remove the LDL buildup from arteries. High levels of triglycerides. Triglycerides are a fatty substance in the blood that is linked to a buildup of plaques in the arteries. What are the causes? There are two main types of dyslipidemia: primary and secondary. Primary dyslipidemia is caused by changes (mutations) in genes that are passed down through families (inherited). These mutations cause several types of dyslipidemia. Secondary  dyslipidemia may be caused by various risk factors that can lead to the disease, such as lifestyle choices and certain medical conditions. What increases the risk? You are more likely to develop this condition if you are an older man or if you are a woman who has gone through menopause. Other risk factors include: Having a family history of dyslipidemia. Taking certain medicines, including birth control pills, steroids, some diuretics, and beta-blockers. Eating a diet high in saturated fat. Smoking cigarettes or excessive alcohol intake. Having certain medical conditions  such as diabetes, polycystic ovary syndrome (PCOS), kidney disease, liver disease, or hypothyroidism. Not exercising regularly. Being overweight or obese with too much belly fat. What are the signs or symptoms? In most cases, dyslipidemia does not usually cause any symptoms. In severe cases, very high lipid levels can cause: Fatty bumps under the skin (xanthomas). A white or gray ring around the black center (pupil) of the eye. Very high triglyceride levels can cause inflammation of the pancreas (pancreatitis). How is this diagnosed? Your health care provider may diagnose dyslipidemia based on a routine blood test (fasting blood test). Because most people do not have symptoms of the condition, this blood testing (lipid profile) is done on adults age 63 and older and is repeated every 4-6 years. This test checks: Total cholesterol. This measures the total amount of cholesterol in your blood, including LDL cholesterol, HDL cholesterol, and triglycerides. A healthy number is below 200 mg/dL (4.82 mmol/L). LDL cholesterol. The target number for LDL cholesterol is different for each person, depending on individual risk factors. A healthy number is usually below 100 mg/dL (7.40 mmol/L). Ask your health care provider what your LDL cholesterol should be. HDL cholesterol. An HDL level of 60 mg/dL (8.44 mmol/L) or higher is best because it  helps to protect against heart disease. A number below 40 mg/dL (8.96 mmol/L) for men or below 50 mg/dL (8.70 mmol/L) for women increases the risk for heart disease. Triglycerides. A healthy triglyceride number is below 150 mg/dL (8.30 mmol/L). If your lipid profile is abnormal, your health care provider may do other blood tests. How is this treated? Treatment depends on the type of dyslipidemia that you have and your other risk factors for heart disease and stroke. Your health care provider will have a target range for your lipid levels based on this information. Treatment for dyslipidemia starts with lifestyle changes, such as diet and exercise. Your health care provider may recommend that you: Get regular exercise. Make changes to your diet. Quit smoking if you smoke. Limit your alcohol intake. If diet changes and exercise do not help you reach your goals, your health care provider may also prescribe medicine to lower lipids. The most commonly prescribed type of medicine lowers your LDL cholesterol (statin drug). If you have a high triglyceride level, your provider may prescribe another type of drug (fibrate) or an omega-3 fish oil supplement, or both. Follow these instructions at home: Eating and drinking  Follow instructions from your health care provider or dietitian about eating or drinking restrictions. Eat a healthy diet as told by your health care provider. This can help you reach and maintain a healthy weight, lower your LDL cholesterol, and raise your HDL cholesterol. This may include: Limiting your calories, if you are overweight. Eating more fruits, vegetables, whole grains, fish, and lean meats. Limiting saturated fat, trans fat, and cholesterol. Do not drink alcohol if: Your health care provider tells you not to drink. You are pregnant, may be pregnant, or are planning to become pregnant. If you drink alcohol: Limit how much you have to: 0-1 drink a day for women. 0-2 drinks  a day for men. Know how much alcohol is in your drink. In the [place] one drink equals one 12 oz bottle of beer (355 mL), one 5 oz glass of wine (148 mL), or one 1 oz glass of hard liquor (44 mL). Activity Get regular exercise. Start an exercise and strength training program as told by your health care provider. Ask your health care  provider what activities are safe for you. Your health care provider may recommend: 30 minutes of aerobic activity 4-6 days a week. Brisk walking is an example of aerobic activity. Strength training 2 days a week. General instructions Do not use any products that contain nicotine or tobacco. These products include cigarettes, chewing tobacco, and vaping devices, such as e-cigarettes. If you need help quitting, ask your health care provider. Take over-the-counter and prescription medicines only as told by your health care provider. This includes supplements. Keep all follow-up visits. This is important. Contact a health care provider if: You are having trouble sticking to your exercise or diet plan. You are struggling to quit smoking or to control your use of alcohol. Summary Dyslipidemia often involves a high level of cholesterol or triglycerides, which are types of lipids. Treatment depends on the type of dyslipidemia that you have and your other risk factors for heart disease and stroke. Treatment for dyslipidemia starts with lifestyle changes, such as diet and exercise. Your health care provider may prescribe medicine to lower lipids. This information is not intended to replace advice given to you by your health care provider. Make sure you discuss any questions you have with your health care provider. Document Revised: 10/08/2021 Document Reviewed: 05/11/2020 Elsevier Patient Education  2025 [employer]. Insomnia Insomnia is a sleep disorder that makes it difficult to fall asleep or stay asleep. Insomnia can cause fatigue, low energy, difficulty concentrating,  mood swings, and poor performance at work or school. There are three different ways to classify insomnia: Difficulty falling asleep. Difficulty staying asleep. Waking up too early in the morning. Any type of insomnia can be long-term (chronic) or short-term (acute). Both are common. Short-term insomnia usually lasts for 3 months or less. Chronic insomnia occurs at least three times a week for longer than 3 months. What are the causes? Insomnia may be caused by another condition, situation, or substance, such as: Having certain mental health conditions, such as anxiety and depression. Using caffeine, alcohol, tobacco, or drugs. Having gastrointestinal conditions, such as gastroesophageal reflux disease (GERD). Having certain medical conditions. These include: Asthma. Alzheimer's disease. Stroke. Chronic pain. An overactive thyroid  gland (hyperthyroidism). Other sleep disorders, such as restless legs syndrome and sleep apnea. Menopause. Sometimes, the cause of insomnia may not be known. What increases the risk? Risk factors for insomnia include: Gender. Females are affected more often than males. Age. Insomnia is more common as people get older. Stress and certain medical and mental health conditions. Lack of exercise. Having an irregular work schedule. This may include working night shifts and traveling between different time zones. What are the signs or symptoms? If you have insomnia, the main symptom is having trouble falling asleep or having trouble staying asleep. This may lead to other symptoms, such as: Feeling tired or having low energy. Feeling nervous about going to sleep. Not feeling rested in the morning. Having trouble concentrating. Feeling irritable, anxious, or depressed. How is this diagnosed? This condition may be diagnosed based on: Your symptoms and medical history. Your health care provider may ask about: Your sleep habits. Any medical conditions you  have. Your mental health. A physical exam. How is this treated? Treatment for insomnia depends on the cause. Treatment may focus on treating an underlying condition that is causing the insomnia. Treatment may also include: Medicines to help you sleep. Counseling or therapy. Lifestyle adjustments to help you sleep better. Follow these instructions at home: Eating and drinking  Limit or avoid alcohol, caffeinated  beverages, and products that contain nicotine and tobacco, especially close to bedtime. These can disrupt your sleep. Do not eat a large meal or eat spicy foods right before bedtime. This can lead to digestive discomfort that can make it hard for you to sleep. Sleep habits  Keep a sleep diary to help you and your health care provider figure out what could be causing your insomnia. Write down: When you sleep. When you wake up during the night. How well you sleep and how rested you feel the next day. Any side effects of medicines you are taking. What you eat and drink. Make your bedroom a dark, comfortable place where it is easy to fall asleep. Put up shades or blackout curtains to block light from outside. Use a white noise machine to block noise. Keep the temperature cool. Limit screen use before bedtime. This includes: Not watching TV. Not using your smartphone, tablet, or computer. Stick to a routine that includes going to bed and waking up at the same times every day and night. This can help you fall asleep faster. Consider making a quiet activity, such as reading, part of your nighttime routine. Try to avoid taking naps during the day so that you sleep better at night. Get out of bed if you are still awake after 15 minutes of trying to sleep. Keep the lights down, but try reading or doing a quiet activity. When you feel sleepy, go back to bed. General instructions Take over-the-counter and prescription medicines only as told by your health care provider. Exercise regularly  as told by your health care provider. However, avoid exercising in the hours right before bedtime. Use relaxation techniques to manage stress. Ask your health care provider to suggest some techniques that may work well for you. These may include: Breathing exercises. Routines to release muscle tension. Visualizing peaceful scenes. Make sure that you drive carefully. Do not drive if you feel very sleepy. Keep all follow-up visits. This is important. Contact a health care provider if: You are tired throughout the day. You have trouble in your daily routine due to sleepiness. You continue to have sleep problems, or your sleep problems get worse. Get help right away if: You have thoughts about hurting yourself or someone else. Get help right away if you feel like you may hurt yourself or others, or have thoughts about taking your own life. Go to your nearest emergency room or: Call 911. Call the National Suicide Prevention Lifeline at 365-353-2091 or 988. This is open 24 hours a day. Text the Crisis Text Line at (609)357-3356. Summary Insomnia is a sleep disorder that makes it difficult to fall asleep or stay asleep. Insomnia can be long-term (chronic) or short-term (acute). Treatment for insomnia depends on the cause. Treatment may focus on treating an underlying condition that is causing the insomnia. Keep a sleep diary to help you and your health care provider figure out what could be causing your insomnia. This information is not intended to replace advice given to you by your health care provider. Make sure you discuss any questions you have with your health care provider. Document Revised: 02/15/2021 Document Reviewed: 02/15/2021 Elsevier Patient Education  2024 [employer] Blood Glucose Monitoring, Adult To manage your diabetes, you'll need to keep track of your blood sugar. This is called blood glucose monitoring. Check your blood glucose as often as told. Keep a journal of your results  over time. This can help you: Know when to adjust your diabetes management plan  with your health care provider. See how food, exercise, illness, and medicines affect your blood glucose. Know what your blood glucose is at any time. Your provider will set specific goals for your blood glucose levels. In many cases, these goals may be: Before meals: 80-130 mg/dL (4.4-7.2 mmol/L). After meals: below 180 mg/dL (10 mmol/L). A1C level: less than 7%. Supplies needed: Blood glucose meter. Test strips for your meter. Each brand of meter has its own strips. You must use the strips that came with your meter. A lancet. This is a sharp device used to poke your finger. Do not use a lancet more than once. A journal or logbook to write down your results. How to check your blood glucose Checking your blood glucose  Wash your hands with soap and water for at least 20 seconds. Use the lancet to poke the side of your finger. Do not poke the tip of your finger. Also, try not to use the same finger each time. Gently squeeze the finger until a small drop of blood appears. Follow the meter instructions on how to insert the test strip, apply blood to the strip, and use the meter. Write down your result and any notes. Using different sites Some blood glucose meters allow testing on other parts of your body to test your blood. The most common places are the forearm, thigh, upper arm, and palm of the hand. Check your meter's instructions. Using different sites may not be as accurate as your fingers. If you think you have low blood glucose, only use your finger. General tips Blood glucose log  Write down the result each time you check your blood glucose. Note anything that may be affecting your blood glucose. This can help you and your provider: Look for patterns over time. Adjust your management plan as needed. Check if your meter has an app or lets you download your records to a computer. Most meters keep a record  of glucose readings in the meter. If you have type 1 diabetes: Check your blood glucose as often as told. This may be: Before each meal and snack. Two hours after a meal. Before bedtime. If you have symptoms of hypoglycemia. After treating your hypoglycemia. Before doing things that have a risk of injury, such as driving or using machinery. Before and after exercise. Between 2:00 a.m. and 3:00 a.m. You may need to check your blood glucose more often, such as up to 6-10 times a day, if: You have diabetes that is not well controlled. You are ill. You have a history of severe hypoglycemia. You have hypoglycemia unawareness. If you have type 2 diabetes: You may need to check your blood glucose 2 or more times a day. Check your blood glucose as often as told by your provider. This may include: Before and after exercise. Before doing things that have a risk of injury, such as driving or using machinery. You may need to check your blood glucose more often if: Your medicine is being adjusted. Your diabetes is not well controlled. You are ill. General tips Always have your blood glucose meter and supplies with you. After you use a few boxes of test strips, adjust your blood glucose meter as needed. Follow the meter instructions. If you have questions or need help, all blood glucose meters have a 24-hour hotline phone number that you can call. Also, contact your provider with any questions or concerns. Where to find more information The American Diabetes Association: diabetes.org The Association of Diabetes Care &  Education Specialists: diabeteseducator.org Contact a health care provider if: Your blood glucose is at or above 240 mg/dL (86.6 mmol/L) for 2 days in a row. You have been sick or have had a fever for 2 days or longer and are not getting better. You have any of these problems for more than 6 hours: You cannot eat or drink. You have nausea or vomiting. You have diarrhea. Get help  right away if: Your blood glucose is lower than 54 mg/dL (3 mmol/L). You become confused, or you have trouble thinking clearly. You have trouble breathing. You have moderate to high ketone levels in your pee. These symptoms may be an emergency. Get help right away. Call 911. Do not wait to see if the symptoms will go away. Do not drive yourself to the hospital. This information is not intended to replace advice given to you by your health care provider. Make sure you discuss any questions you have with your health care provider. Document Revised: 10/18/2022 Document Reviewed: 01/21/2022 Elsevier Patient Education  2024 Else[employer] Inc.  The above assessment and management plan was discussed with the patient. The patient verbalized understanding of and has agreed to the management plan. Patient is aware to call the clinic if they develop any new symptoms or if symptoms persist or worsen. Patient is aware when to return to the clinic for a follow-up visit. Patient educated on when it is appropriate to go to the emergency department.  Macgregor Aeschliman [place] Thompson, DNP [facility] [address], [place] [address] [phone]

## 2024-01-29 ENCOUNTER — Ambulatory Visit: Payer: Self-pay | Admitting: Nurse Practitioner

## 2024-01-29 ENCOUNTER — Encounter: Payer: Self-pay | Admitting: Nurse Practitioner

## 2024-01-29 VITALS — BP 134/70 | HR 94 | Temp 97.9°F | Ht 73.0 in | Wt 254.2 lb

## 2024-01-29 DIAGNOSIS — R7303 Prediabetes: Secondary | ICD-10-CM

## 2024-01-29 DIAGNOSIS — E66811 Obesity, class 1: Secondary | ICD-10-CM

## 2024-01-29 DIAGNOSIS — E781 Pure hyperglyceridemia: Secondary | ICD-10-CM | POA: Diagnosis not present

## 2024-01-29 DIAGNOSIS — E785 Hyperlipidemia, unspecified: Secondary | ICD-10-CM | POA: Diagnosis not present

## 2024-01-29 DIAGNOSIS — Z6832 Body mass index (BMI) 32.0-32.9, adult: Secondary | ICD-10-CM

## 2024-01-29 DIAGNOSIS — G4726 Circadian rhythm sleep disorder, shift work type: Secondary | ICD-10-CM

## 2024-01-29 DIAGNOSIS — E782 Mixed hyperlipidemia: Secondary | ICD-10-CM

## 2024-01-29 DIAGNOSIS — N529 Male erectile dysfunction, unspecified: Secondary | ICD-10-CM

## 2024-01-29 DIAGNOSIS — G4709 Other insomnia: Secondary | ICD-10-CM

## 2024-01-29 DIAGNOSIS — G4700 Insomnia, unspecified: Secondary | ICD-10-CM

## 2024-01-29 LAB — BAYER DCA HB A1C WAIVED: HB A1C (BAYER DCA - WAIVED): 5.7 % — ABNORMAL HIGH (ref 4.8–5.6)

## 2024-01-29 MED ORDER — QUETIAPINE FUMARATE 25 MG PO TABS: 25.0000 mg | ORAL_TABLET | Freq: Every day | ORAL | 0 refills | Status: AC

## 2024-01-29 MED ORDER — SILDENAFIL CITRATE 25 MG PO TABS: 25.0000 mg | ORAL_TABLET | Freq: Every day | ORAL | 0 refills | Status: AC | PRN

## 2024-01-29 MED ORDER — OMEGA-3-ACID ETHYL ESTERS 1 G PO CAPS: 1.0000 g | ORAL_CAPSULE | Freq: Two times a day (BID) | ORAL | 0 refills | Status: AC

## 2024-01-29 MED ORDER — METFORMIN HCL 500 MG PO TABS: 500.0000 mg | ORAL_TABLET | Freq: Two times a day (BID) | ORAL | 3 refills | Status: AC

## 2024-01-29 MED ORDER — FENOFIBRATE 48 MG PO TABS: 48.0000 mg | ORAL_TABLET | Freq: Every day | ORAL | 0 refills | Status: AC

## 2024-01-29 NOTE — Assessment & Plan Note (Signed)
 Lifestyle modification, increase intake of fresh fruit and vegetables, and stay active. Continue Metformin 

## 2024-01-30 ENCOUNTER — Telehealth: Payer: Self-pay | Admitting: Pharmacy Technician

## 2024-01-30 ENCOUNTER — Ambulatory Visit: Payer: Self-pay | Admitting: Nurse Practitioner

## 2024-01-30 ENCOUNTER — Other Ambulatory Visit (HOSPITAL_COMMUNITY): Payer: Self-pay

## 2024-01-30 NOTE — Telephone Encounter (Signed)
Noted  -LS

## 2024-01-30 NOTE — Telephone Encounter (Signed)
 Pharmacy Patient Advocate Encounter  Received notification from [facility] that Prior Authorization for Omega-3-acid  Ethyl Esters 1GM capsules has been APPROVED from 01/30/24 to 01/29/25. Ran test claim, Copay is $5.00. This test claim was processed through [facility]- copay amounts may vary at other pharmacies due to pharmacy/plan contracts, or as the patient moves through the different stages of their insurance plan.   PA #/Case ID/Reference #: [id number]

## 2024-01-30 NOTE — Telephone Encounter (Signed)
 Pharmacy Patient Advocate Encounter   Received notification from [employer] that prior authorization for Omega-3-acid  Ethyl Esters 1GM capsules is required/requested.   Insurance verification completed.   The patient is insured through [id number].   Per test claim: PA required; PA submitted to above mentioned insurance via Latent Key/confirmation #/[id number] Status is pending

## 2024-01-31 ENCOUNTER — Other Ambulatory Visit (HOSPITAL_COMMUNITY): Payer: Self-pay

## 2024-03-29 ENCOUNTER — Encounter: Payer: Self-pay | Admitting: *Deleted

## 2024-05-24 ENCOUNTER — Encounter: Admitting: Family Medicine

## 2024-05-27 ENCOUNTER — Ambulatory Visit: Admitting: Nurse Practitioner
